# Patient Record
Sex: Female | Born: 1950
Health system: Southern US, Community
[De-identification: ages and names within clinical notes are randomized; demographics above are authoritative.]

## PROBLEM LIST (undated history)

## (undated) ENCOUNTER — Encounter

## (undated) ENCOUNTER — Ambulatory Visit

## (undated) ENCOUNTER — Ambulatory Visit: Attending: Critical Care Medicine | Primary: Critical Care Medicine

## (undated) ENCOUNTER — Telehealth

## (undated) ENCOUNTER — Encounter: Attending: Registered" | Primary: Registered"

## (undated) ENCOUNTER — Encounter
Attending: Student in an Organized Health Care Education/Training Program | Primary: Student in an Organized Health Care Education/Training Program

## (undated) ENCOUNTER — Encounter: Attending: Internal Medicine | Primary: Internal Medicine

## (undated) DIAGNOSIS — I10 Essential (primary) hypertension: Secondary | ICD-10-CM

## (undated) HISTORY — PX: EYE SURGERY: SHX253

## (undated) HISTORY — DX: Essential (primary) hypertension: I10

---

## 2008-02-03 DIAGNOSIS — J449 Chronic obstructive pulmonary disease, unspecified: Secondary | ICD-10-CM | POA: Insufficient documentation

## 2012-12-14 ENCOUNTER — Other Ambulatory Visit: Payer: Self-pay | Admitting: *Deleted

## 2012-12-14 DIAGNOSIS — I714 Abdominal aortic aneurysm, without rupture, unspecified: Secondary | ICD-10-CM

## 2012-12-16 ENCOUNTER — Other Ambulatory Visit: Payer: Self-pay

## 2012-12-19 ENCOUNTER — Ambulatory Visit
Admission: RE | Admit: 2012-12-19 | Discharge: 2012-12-19 | Disposition: A | Discharge status: Home / Self Care | Payer: No Typology Code available for payment source | Source: Ambulatory Visit | Attending: *Deleted | Admitting: *Deleted

## 2012-12-19 DIAGNOSIS — I714 Abdominal aortic aneurysm, without rupture, unspecified: Secondary | ICD-10-CM

## 2013-04-26 DIAGNOSIS — E041 Nontoxic single thyroid nodule: Secondary | ICD-10-CM | POA: Diagnosis not present

## 2013-04-26 DIAGNOSIS — E079 Disorder of thyroid, unspecified: Secondary | ICD-10-CM | POA: Diagnosis not present

## 2013-05-09 DIAGNOSIS — I781 Nevus, non-neoplastic: Secondary | ICD-10-CM | POA: Diagnosis not present

## 2013-05-09 DIAGNOSIS — L819 Disorder of pigmentation, unspecified: Secondary | ICD-10-CM | POA: Diagnosis not present

## 2013-05-09 DIAGNOSIS — D485 Neoplasm of uncertain behavior of skin: Secondary | ICD-10-CM | POA: Diagnosis not present

## 2013-05-09 DIAGNOSIS — D045 Carcinoma in situ of skin of trunk: Secondary | ICD-10-CM | POA: Diagnosis not present

## 2013-05-09 DIAGNOSIS — L570 Actinic keratosis: Secondary | ICD-10-CM | POA: Diagnosis not present

## 2013-05-17 DIAGNOSIS — E063 Autoimmune thyroiditis: Secondary | ICD-10-CM | POA: Diagnosis not present

## 2013-06-07 DIAGNOSIS — D045 Carcinoma in situ of skin of trunk: Secondary | ICD-10-CM | POA: Diagnosis not present

## 2013-06-07 DIAGNOSIS — L578 Other skin changes due to chronic exposure to nonionizing radiation: Secondary | ICD-10-CM | POA: Diagnosis not present

## 2013-07-10 DIAGNOSIS — E069 Thyroiditis, unspecified: Secondary | ICD-10-CM | POA: Diagnosis not present

## 2013-09-19 DIAGNOSIS — H43819 Vitreous degeneration, unspecified eye: Secondary | ICD-10-CM | POA: Diagnosis not present

## 2013-10-12 DIAGNOSIS — L570 Actinic keratosis: Secondary | ICD-10-CM | POA: Diagnosis not present

## 2014-03-21 DIAGNOSIS — E040 Nontoxic diffuse goiter: Secondary | ICD-10-CM | POA: Diagnosis not present

## 2014-03-21 DIAGNOSIS — Z0001 Encounter for general adult medical examination with abnormal findings: Secondary | ICD-10-CM | POA: Diagnosis not present

## 2014-04-13 DIAGNOSIS — L718 Other rosacea: Secondary | ICD-10-CM | POA: Diagnosis not present

## 2014-05-04 DIAGNOSIS — H04123 Dry eye syndrome of bilateral lacrimal glands: Secondary | ICD-10-CM | POA: Diagnosis not present

## 2014-05-29 DIAGNOSIS — H04123 Dry eye syndrome of bilateral lacrimal glands: Secondary | ICD-10-CM | POA: Diagnosis not present

## 2014-07-12 DIAGNOSIS — H01001 Unspecified blepharitis right upper eyelid: Secondary | ICD-10-CM | POA: Diagnosis not present

## 2014-07-12 DIAGNOSIS — H04123 Dry eye syndrome of bilateral lacrimal glands: Secondary | ICD-10-CM | POA: Insufficient documentation

## 2014-07-12 DIAGNOSIS — H0259 Other disorders affecting eyelid function: Secondary | ICD-10-CM | POA: Diagnosis not present

## 2014-07-12 DIAGNOSIS — H029 Unspecified disorder of eyelid: Secondary | ICD-10-CM | POA: Diagnosis not present

## 2014-07-12 DIAGNOSIS — H04209 Unspecified epiphora, unspecified lacrimal gland: Secondary | ICD-10-CM | POA: Diagnosis not present

## 2014-07-12 DIAGNOSIS — H02139 Senile ectropion of unspecified eye, unspecified eyelid: Secondary | ICD-10-CM | POA: Diagnosis not present

## 2014-08-13 DIAGNOSIS — J209 Acute bronchitis, unspecified: Secondary | ICD-10-CM | POA: Diagnosis not present

## 2014-08-29 DIAGNOSIS — R0602 Shortness of breath: Secondary | ICD-10-CM | POA: Diagnosis not present

## 2014-08-29 DIAGNOSIS — R918 Other nonspecific abnormal finding of lung field: Secondary | ICD-10-CM | POA: Diagnosis not present

## 2014-08-29 DIAGNOSIS — J189 Pneumonia, unspecified organism: Secondary | ICD-10-CM | POA: Diagnosis not present

## 2014-09-12 DIAGNOSIS — J168 Pneumonia due to other specified infectious organisms: Secondary | ICD-10-CM | POA: Diagnosis not present

## 2014-09-12 DIAGNOSIS — J4520 Mild intermittent asthma, uncomplicated: Secondary | ICD-10-CM | POA: Diagnosis not present

## 2014-10-27 DIAGNOSIS — H60391 Other infective otitis externa, right ear: Secondary | ICD-10-CM | POA: Diagnosis not present

## 2014-10-31 DIAGNOSIS — J4520 Mild intermittent asthma, uncomplicated: Secondary | ICD-10-CM | POA: Diagnosis not present

## 2014-10-31 DIAGNOSIS — J159 Unspecified bacterial pneumonia: Secondary | ICD-10-CM | POA: Diagnosis not present

## 2014-11-01 DIAGNOSIS — H6122 Impacted cerumen, left ear: Secondary | ICD-10-CM | POA: Diagnosis not present

## 2014-11-01 DIAGNOSIS — B369 Superficial mycosis, unspecified: Secondary | ICD-10-CM | POA: Diagnosis not present

## 2014-11-07 DIAGNOSIS — B369 Superficial mycosis, unspecified: Secondary | ICD-10-CM | POA: Diagnosis not present

## 2014-11-07 DIAGNOSIS — H6063 Unspecified chronic otitis externa, bilateral: Secondary | ICD-10-CM | POA: Diagnosis not present

## 2014-12-20 DIAGNOSIS — H43813 Vitreous degeneration, bilateral: Secondary | ICD-10-CM | POA: Diagnosis not present

## 2014-12-31 DIAGNOSIS — Z85828 Personal history of other malignant neoplasm of skin: Secondary | ICD-10-CM | POA: Diagnosis not present

## 2014-12-31 DIAGNOSIS — Z08 Encounter for follow-up examination after completed treatment for malignant neoplasm: Secondary | ICD-10-CM | POA: Diagnosis not present

## 2014-12-31 DIAGNOSIS — L814 Other melanin hyperpigmentation: Secondary | ICD-10-CM | POA: Diagnosis not present

## 2014-12-31 DIAGNOSIS — D1801 Hemangioma of skin and subcutaneous tissue: Secondary | ICD-10-CM | POA: Diagnosis not present

## 2014-12-31 DIAGNOSIS — L570 Actinic keratosis: Secondary | ICD-10-CM | POA: Diagnosis not present

## 2014-12-31 DIAGNOSIS — L821 Other seborrheic keratosis: Secondary | ICD-10-CM | POA: Diagnosis not present

## 2014-12-31 DIAGNOSIS — L218 Other seborrheic dermatitis: Secondary | ICD-10-CM | POA: Diagnosis not present

## 2015-01-07 DIAGNOSIS — H16213 Exposure keratoconjunctivitis, bilateral: Secondary | ICD-10-CM | POA: Diagnosis not present

## 2015-01-07 DIAGNOSIS — H02102 Unspecified ectropion of right lower eyelid: Secondary | ICD-10-CM | POA: Diagnosis not present

## 2015-01-07 DIAGNOSIS — H04223 Epiphora due to insufficient drainage, bilateral lacrimal glands: Secondary | ICD-10-CM | POA: Diagnosis not present

## 2015-01-07 DIAGNOSIS — E063 Autoimmune thyroiditis: Secondary | ICD-10-CM | POA: Diagnosis not present

## 2015-01-07 DIAGNOSIS — H02132 Senile ectropion of right lower eyelid: Secondary | ICD-10-CM | POA: Diagnosis not present

## 2015-01-07 DIAGNOSIS — Z880 Allergy status to penicillin: Secondary | ICD-10-CM | POA: Diagnosis not present

## 2015-01-07 DIAGNOSIS — H02135 Senile ectropion of left lower eyelid: Secondary | ICD-10-CM | POA: Diagnosis not present

## 2015-01-07 DIAGNOSIS — Z79899 Other long term (current) drug therapy: Secondary | ICD-10-CM | POA: Diagnosis not present

## 2015-01-07 DIAGNOSIS — H02105 Unspecified ectropion of left lower eyelid: Secondary | ICD-10-CM | POA: Diagnosis not present

## 2015-03-18 DIAGNOSIS — L570 Actinic keratosis: Secondary | ICD-10-CM | POA: Diagnosis not present

## 2015-03-27 ENCOUNTER — Ambulatory Visit: Payer: Self-pay | Admitting: Osteopathic Medicine

## 2015-04-01 ENCOUNTER — Encounter: Payer: Self-pay | Admitting: Osteopathic Medicine

## 2015-04-01 ENCOUNTER — Ambulatory Visit (INDEPENDENT_AMBULATORY_CARE_PROVIDER_SITE_OTHER): Payer: Medicare Other | Admitting: Osteopathic Medicine

## 2015-04-01 VITALS — BP 125/75 | HR 80 | Ht 62.0 in | Wt 123.0 lb

## 2015-04-01 DIAGNOSIS — E785 Hyperlipidemia, unspecified: Secondary | ICD-10-CM

## 2015-04-01 DIAGNOSIS — M79604 Pain in right leg: Secondary | ICD-10-CM | POA: Diagnosis not present

## 2015-04-01 DIAGNOSIS — Z8639 Personal history of other endocrine, nutritional and metabolic disease: Secondary | ICD-10-CM | POA: Diagnosis not present

## 2015-04-01 DIAGNOSIS — Z79899 Other long term (current) drug therapy: Secondary | ICD-10-CM | POA: Diagnosis not present

## 2015-04-01 DIAGNOSIS — M810 Age-related osteoporosis without current pathological fracture: Secondary | ICD-10-CM

## 2015-04-01 DIAGNOSIS — Z780 Asymptomatic menopausal state: Secondary | ICD-10-CM | POA: Diagnosis not present

## 2015-04-01 DIAGNOSIS — Z1159 Encounter for screening for other viral diseases: Secondary | ICD-10-CM

## 2015-04-01 NOTE — Progress Notes (Signed)
HPI: Bailey Evans is a 65 y.o. female who presents to [place] today for chief complaint of:  Chief Complaint  Patient presents with  . Establish Care     HIP PAIN  . Location: R hip/buttock . Quality: soreness, no shooting pain or numbness . Timing: worse after sitting, intermittent   . Context: no injury   . Assoc signs/symptoms: no shooting pains down leg, feels a bit dull and sore, no weakness in leg  HISTORY OF THYROID PROBLEMS - Sent to endocrinologist after abnormal thyroid test, sent for scan which was normal. Records reviewed in Care Everywhere, pt was to follow-up as needed w/ endocrine, [place] report mentions mild abn echogenicity c/w thyroiditis but no nodule was noted    Past medical, social and family history reviewed: History reviewed. No pertinent past medical history. History reviewed. No pertinent past surgical history. Social History  Substance Use Topics  . Smoking status: Never Smoker   . Smokeless tobacco: Not on file  . Alcohol Use: Not on file   No family history on file.  Current Outpatient Prescriptions  Medication Sig Dispense Refill  . Calcium-Vitamin D 600-200 MG-UNIT tablet Take by mouth.    . Flaxseed, Linseed, (FLAXSEED OIL) 1000 MG CAPS Take by mouth.    . Multiple Vitamin (MULTI-VITAMINS) TABS Take by mouth.    . Omega-3 Fatty Acids (FISH OIL PO) Take by mouth.     No current facility-administered medications for this visit.   Allergies  Allergen Reactions  . Penicillin G Rash      Review of Systems: CONSTITUTIONAL:  No  fever, no chills, No  unintentional weight changes HEAD/EYES/EARS/NOSE/THROAT: No  headache, no vision change, no hearing change, No  sore throat, No  sinus pressure CARDIAC: No  chest pain, No  pressure, No palpitations, No  orthopnea RESPIRATORY: No  cough, No  shortness of breath/wheeze GASTROINTESTINAL: No  nausea, No  vomiting, No  abdominal pain, No  blood in stool, No   diarrhea, No  constipation  MUSCULOSKELETAL: (+) myalgia/arthralgia GENITOURINARY: No  incontinence, No  abnormal genital bleeding/discharge, (+) nighttime urination SKIN: No  rash/wounds/concerning lesions HEM/ONC: No  easy bruising/bleeding, No  abnormal lymph node ENDOCRINE: No polyuria/polydipsia/polyphagia, No  heat/cold intolerance  NEUROLOGIC: No  weakness, No  dizziness, No  slurred speech PSYCHIATRIC: No  concerns with depression, No  concerns with anxiety, No sleep problems  Exam:  BP 125/75 mmHg  Pulse 80  Ht 5' 2"  (1.575 m)  Wt 123 lb (55.792 kg)  BMI 22.49 kg/m2 Constitutional: VS see above. General Appearance: alert, well-developed, well-nourished, NAD Eyes: Normal lids and conjunctive, non-icteric sclera, PERRLA Ears, Nose, Mouth, Throat: MMM, Normal external inspection ears/nares/mouth/lips/gums, TM normal bilaterally. Pharynx no erythema, no exudate.  Neck: No masses, trachea midline. No thyroid enlargement/tenderness/mass appreciated. No lymphadenopathy Respiratory: Normal respiratory effort. no wheeze, no rhonchi, no rales Cardiovascular: S1/S2 normal, no murmur, no rub/gallop auscultated. RRR. No lower extremity edema. Gastrointestinal: Nontender, no masses. No hepatomegaly, no splenomegaly. No hernia appreciated. Bowel sounds normal. Rectal exam deferred.  Musculoskeletal: Gait normal. No clubbing/cyanosis of digits. Neg straight leg raise bilaterally, neg Trendelenberg  Neurological: No cranial nerve deficit on limited exam. Motor and sensation intact and symmetric Skin: warm, dry, intact. No rash/ulcer. No concerning nevi or subq nodules on limited exam.   Psychiatric: Normal judgment/insight. Normal mood and affect. Oriented x3.    Results for orders placed or performed in visit on 04/01/15 (from the past 72 hour(s))  CBC with Differential/Platelet     Status: None   Collection Time: 04/01/15  2:12 PM  Result Value Ref Range   WBC 6.4 4.0 - 10.5 K/uL   RBC  4.61 3.87 - 5.11 MIL/uL   Hemoglobin 14.3 12.0 - 15.0 g/dL   HCT 42.5 36.0 - 46.0 %   MCV 92.2 78.0 - 100.0 fL   MCH 31.0 26.0 - 34.0 pg   MCHC 33.6 30.0 - 36.0 g/dL   RDW 13.4 11.5 - 15.5 %   Platelets 381 150 - 400 K/uL   MPV 8.9 8.6 - 12.4 fL   Neutrophils Relative % 59 43 - 77 %   Neutro Abs 3.8 1.7 - 7.7 K/uL   Lymphocytes Relative 28 12 - 46 %   Lymphs Abs 1.8 0.7 - 4.0 K/uL   Monocytes Relative 9 3 - 12 %   Monocytes Absolute 0.6 0.1 - 1.0 K/uL   Eosinophils Relative 3 0 - 5 %   Eosinophils Absolute 0.2 0.0 - 0.7 K/uL   Basophils Relative 1 0 - 1 %   Basophils Absolute 0.1 0.0 - 0.1 K/uL   Smear Review Criteria for review not met   COMPLETE METABOLIC PANEL WITH GFR     Status: None   Collection Time: 04/01/15  2:12 PM  Result Value Ref Range   Sodium 135 135 - 146 mmol/L   Potassium 5.1 3.5 - 5.3 mmol/L   Chloride 99 98 - 110 mmol/L   CO2 28 20 - 31 mmol/L   Glucose, Bld 99 65 - 99 mg/dL   BUN 14 7 - 25 mg/dL   Creat 0.59 0.50 - 0.99 mg/dL   Total Bilirubin 0.3 0.2 - 1.2 mg/dL   Alkaline Phosphatase 71 33 - 130 U/L   AST 20 10 - 35 U/L   ALT 14 6 - 29 U/L   Total Protein 6.7 6.1 - 8.1 g/dL   Albumin 4.2 3.6 - 5.1 g/dL   Calcium 9.3 8.6 - 10.4 mg/dL   GFR, Est African American >89 >=60 mL/min   GFR, Est Non African American >89 >=60 mL/min    Comment:   The estimated GFR is a calculation valid for adults (>=59 years old) that uses the CKD-EPI algorithm to adjust for age and sex. It is   not to be used for children, pregnant women, hospitalized patients,    patients on dialysis, or with rapidly changing kidney function. According to the NKDEP, eGFR >89 is normal, 60-89 shows mild impairment, 30-59 shows moderate impairment, 15-29 shows severe impairment and <15 is ESRD.     TSH     Status: None   Collection Time: 04/01/15  2:12 PM  Result Value Ref Range   TSH 1.16 mIU/L    Comment:   Reference Range   > or = 20 Years  0.40-4.50   Pregnancy Range First  trimester  0.26-2.66 Second trimester 0.55-2.73 Third trimester  0.43-2.91   ** Please note change in unit of measure and reference range(s). **     VITAMIN D 25 Hydroxy (Vit-D Deficiency, Fractures)     Status: None   Collection Time: 04/01/15  2:12 PM  Result Value Ref Range   Vit D, 25-Hydroxy 65 30 - 100 ng/mL    Comment: Vitamin D Status           25-OH Vitamin D        Deficiency                <  20 ng/mL        Insufficiency         20 - 29 ng/mL        Optimal             > or = 30 ng/mL   For 25-OH Vitamin D testing on patients on D2-supplementation and patients for whom quantitation of D2 and D3 fractions is required, the QuestAssureD 25-OH VIT D, (D2,D3), LC/MS/MS is recommended: order code [id number] (patients > 2 yrs).   Hepatitis C antibody     Status: None   Collection Time: 04/01/15  2:12 PM  Result Value Ref Range   HCV Ab NEGATIVE NEGATIVE  Lipid panel     Status: None   Collection Time: 04/01/15  2:12 PM  Result Value Ref Range   Cholesterol 185 125 - 200 mg/dL   Triglycerides 71 <150 mg/dL   HDL 97 >=46 mg/dL   Total CHOL/HDL Ratio 1.9 <=5.0 Ratio   VLDL 14 <30 mg/dL   LDL Cholesterol 74 <130 mg/dL    Comment:   Total Cholesterol/HDL Ratio:CHD Risk                        Coronary Heart Disease Risk Table                                        Men       Women          1/2 Average Risk              3.4        3.3              Average Risk              5.0        4.4           2X Average Risk              9.6        7.1           3X Average Risk             23.4       11.0 Use the calculated Patient Ratio above and the CHD Risk table  to determine the patient's CHD Risk.       ASSESSMENT/PLAN:  Medication management - Plan: CBC with Differential/Platelet, COMPLETE METABOLIC PANEL WITH GFR  History of thyroid disease - Plan: TSH  Postmenopausal - Plan: VITAMIN D 25 Hydroxy (Vit-D Deficiency, Fractures)  Osteoporosis - Plan: VITAMIN D 25 Hydroxy  (Vit-D Deficiency, Fractures), DG Bone Density  Need for hepatitis C screening test - Plan: Hepatitis C antibody  Hyperlipidemia - Plan: Lipid panel    FEMALE PREVENTIVE CARE  ANNUAL SCREENING/COUNSELING Tobacco - Never  Alcohol - social drinker Diet/Exercise - HEALTHY HABITS DISCUSSED TO DECREASE CV RISK Sexual Health - None STI - The patient denies history of sexually transmitted disease. INTERESTED IN STI TESTING - no Depression - PQH2 Negative Domestic violence concerns - no HTN SCREENING - SEE VITALS Vaccination status - SEE BELOW  INFECTIOUS DISEASE SCREENING HIV - all adults 15-65 - does not need GC/CT - sexually active - does not need HepC - born 79-1967 - needs TB - if risk/required by employer - does not need  DISEASE SCREENING Lipid - (  Low risk screen M35/F45; High risk screen M25/F35 if HTN, Tob, FH CHD M<55/F<65) - needs DM2 (45+ or Risk = FH 1st deg DM, Hx GDM, overweight/sedentary, high-risk ethnicity, HTN) - needs Osteoporosis - age 37+ or one sooner if risk - needs - Hx on Fosamax for Osteoporosis and had problems with myalgia   CANCER SCREENING Cervical - Pap q3 yr age 52+, Pap + HPV q5y age 43+ - PAP - does not need Breast - Mammo age 32+ (C) and biennial age 51-75 (A) - 54 - needs, will defer for now Lung - annual low dose CT Chest age 83-75 w/ 30+ PY, current/quit past 15 years - CT - does not need Colon - age 54+ or 67 years of age prior to [place] Dx - GI REFERRAL - does not need screened 7 or 8 years ago  ADULT VACCINATION Influenza - annual - already has Td booster every 10 years - was offered and declined by the patient HPV - age <60yo - was not indicated Zoster - age 68+ - already has Pneumonia - age 73+ sooner if risk (DM, smoker, other) - already has, needs second vax this upcoming fall  OTHER Fall - exercise and Vit D age 67+ - needs Consider ASA - age 89-59 - does not need   Return in about 8 months (around 11/29/2015), or as needed and  for annual wellness exams, may change followup depending on lab results, for FLU VACCINE AND SECOND PNEUMONIA VACCINE.

## 2015-04-02 DIAGNOSIS — M79604 Pain in right leg: Secondary | ICD-10-CM | POA: Insufficient documentation

## 2015-04-02 DIAGNOSIS — Z780 Asymptomatic menopausal state: Secondary | ICD-10-CM | POA: Insufficient documentation

## 2015-04-02 DIAGNOSIS — M810 Age-related osteoporosis without current pathological fracture: Secondary | ICD-10-CM | POA: Insufficient documentation

## 2015-04-02 LAB — CBC WITH DIFFERENTIAL/PLATELET
Basophils Absolute: 0.1 10*3/uL (ref 0.0–0.1)
Basophils Relative: 1 % (ref 0–1)
EOS PCT: 3 % (ref 0–5)
Eosinophils Absolute: 0.2 10*3/uL (ref 0.0–0.7)
HEMATOCRIT: 42.5 % (ref 36.0–46.0)
Hemoglobin: 14.3 g/dL (ref 12.0–15.0)
LYMPHS ABS: 1.8 10*3/uL (ref 0.7–4.0)
LYMPHS PCT: 28 % (ref 12–46)
MCH: 31 pg (ref 26.0–34.0)
MCHC: 33.6 g/dL (ref 30.0–36.0)
MCV: 92.2 fL (ref 78.0–100.0)
MPV: 8.9 fL (ref 8.6–12.4)
Monocytes Absolute: 0.6 10*3/uL (ref 0.1–1.0)
Monocytes Relative: 9 % (ref 3–12)
NEUTROS ABS: 3.8 10*3/uL (ref 1.7–7.7)
Neutrophils Relative %: 59 % (ref 43–77)
Platelets: 381 10*3/uL (ref 150–400)
RBC: 4.61 MIL/uL (ref 3.87–5.11)
RDW: 13.4 % (ref 11.5–15.5)
WBC: 6.4 10*3/uL (ref 4.0–10.5)

## 2015-04-02 LAB — COMPLETE METABOLIC PANEL WITH GFR
ALK PHOS: 71 U/L (ref 33–130)
ALT: 14 U/L (ref 6–29)
AST: 20 U/L (ref 10–35)
Albumin: 4.2 g/dL (ref 3.6–5.1)
BUN: 14 mg/dL (ref 7–25)
CALCIUM: 9.3 mg/dL (ref 8.6–10.4)
CO2: 28 mmol/L (ref 20–31)
CREATININE: 0.59 mg/dL (ref 0.50–0.99)
Chloride: 99 mmol/L (ref 98–110)
GFR, Est Non African American: >89 mL/min (ref >=60)
Glucose, Bld: 99 mg/dL (ref 65–99)
Potassium: 5.1 mmol/L (ref 3.5–5.3)
Sodium: 135 mmol/L (ref 135–146)
TOTAL PROTEIN: 6.7 g/dL (ref 6.1–8.1)
Total Bilirubin: 0.3 mg/dL (ref 0.2–1.2)

## 2015-04-02 LAB — LIPID PANEL
CHOL/HDL RATIO: 1.9 ratio (ref <=5.0)
CHOLESTEROL: 185 mg/dL (ref 125–200)
HDL: 97 mg/dL (ref >=46)
LDL Cholesterol: 74 mg/dL (ref <130)
TRIGLYCERIDES: 71 mg/dL (ref <150)
VLDL: 14 mg/dL (ref <30)

## 2015-04-02 LAB — HEPATITIS C ANTIBODY: HCV AB: NEGATIVE

## 2015-04-02 LAB — VITAMIN D 25 HYDROXY (VIT D DEFICIENCY, FRACTURES): VIT D 25 HYDROXY: 65 ng/mL (ref 30–100)

## 2015-04-02 LAB — TSH: TSH: 1.16 mIU/L

## 2015-04-04 ENCOUNTER — Ambulatory Visit (INDEPENDENT_AMBULATORY_CARE_PROVIDER_SITE_OTHER): Payer: Medicare Other | Admitting: Osteopathic Medicine

## 2015-04-04 ENCOUNTER — Encounter: Payer: Self-pay | Admitting: Osteopathic Medicine

## 2015-04-04 VITALS — BP 146/73 | HR 71 | Ht 62.0 in | Wt 124.0 lb

## 2015-04-04 DIAGNOSIS — M25571 Pain in right ankle and joints of right foot: Secondary | ICD-10-CM | POA: Diagnosis not present

## 2015-04-04 DIAGNOSIS — M25472 Effusion, left ankle: Secondary | ICD-10-CM | POA: Diagnosis not present

## 2015-04-04 DIAGNOSIS — M25572 Pain in left ankle and joints of left foot: Secondary | ICD-10-CM | POA: Diagnosis not present

## 2015-04-04 NOTE — Progress Notes (Signed)
HPI: Bailey Evans is a 65 y.o. female who presents to [place] today for chief complaint of:  Chief Complaint  Patient presents with  . Joint Swelling    LEFT ANKLE   ANKLE PAIN/STIFFNESS  . Location: L ankle a bit worse than R, both ankles are a problem though better today thanwhen she made the appt . Quality: swelling, pain . Severity: mild, better today . Duration: started about a week ago  . Context: recently walking on sand a lot while at the beach and then shortly thereafter started having stiffness, no hx fall or other injury . Modifying factors: no Rx/OTC meds tried . Assoc signs/symptoms: no numbness/tingling, no calf pain/claudicatoin, no CP/SOB   Past medical, social and family history reviewed: No past medical history on file. No past surgical history on file. Social History  Substance Use Topics  . Smoking status: Never Smoker   . Smokeless tobacco: Not on file  . Alcohol Use: Not on file   Family History  Problem Relation Age of Onset  . Cancer Mother   . Heart attack Father     Current Outpatient Prescriptions  Medication Sig Dispense Refill  . Calcium-Vitamin D 600-200 MG-UNIT tablet Take by mouth.    . Flaxseed, Linseed, (FLAXSEED OIL) 1000 MG CAPS Take by mouth.    . Multiple Vitamin (MULTI-VITAMINS) TABS Take by mouth.    . Omega-3 Fatty Acids (FISH OIL PO) Take by mouth.     No current facility-administered medications for this visit.   Allergies  Allergen Reactions  . Penicillin G Rash       Review of Systems: MUSCULOSKELETAL: (+) hip and ankle myalgia/arthralgia SKIN: No  rash/wounds/concerning lesions NEUROLOGIC: No  weakness, No  Dizziness  Exam:  BP 146/73 mmHg  Pulse 71  Ht 5' 2"  (1.575 m)  Wt 124 lb (56.246 kg)  BMI 22.67 kg/m2 Constitutional: VS see above. General Appearance: alert, well-developed, well-nourished, NAD Musculoskeletal: Gait normal. No clubbing/cyanosis of digits. Ankles  non-tender medial and lateral malleoli, 0 edema. Drawer test eng bilaterally, Homans neg bilaterally Neurological: . Motor and sensation intact and symmetric Skin: warm, dry, intact. No rash/ulcer. No concerning nevi or subq nodules on limited exam.   Psychiatric: Normal judgment/insight. Normal mood and affect. Oriented x3.    Results for orders placed or performed in visit on 04/01/15 (from the past 72 hour(s))  CBC with Differential/Platelet     Status: None   Collection Time: 04/01/15  2:12 PM  Result Value Ref Range   WBC 6.4 4.0 - 10.5 K/uL   RBC 4.61 3.87 - 5.11 MIL/uL   Hemoglobin 14.3 12.0 - 15.0 g/dL   HCT 42.5 36.0 - 46.0 %   MCV 92.2 78.0 - 100.0 fL   MCH 31.0 26.0 - 34.0 pg   MCHC 33.6 30.0 - 36.0 g/dL   RDW 13.4 11.5 - 15.5 %   Platelets 381 150 - 400 K/uL   MPV 8.9 8.6 - 12.4 fL   Neutrophils Relative % 59 43 - 77 %   Neutro Abs 3.8 1.7 - 7.7 K/uL   Lymphocytes Relative 28 12 - 46 %   Lymphs Abs 1.8 0.7 - 4.0 K/uL   Monocytes Relative 9 3 - 12 %   Monocytes Absolute 0.6 0.1 - 1.0 K/uL   Eosinophils Relative 3 0 - 5 %   Eosinophils Absolute 0.2 0.0 - 0.7 K/uL   Basophils Relative 1 0 - 1 %   Basophils Absolute  0.1 0.0 - 0.1 K/uL   Smear Review Criteria for review not met   COMPLETE METABOLIC PANEL WITH GFR     Status: None   Collection Time: 04/01/15  2:12 PM  Result Value Ref Range   Sodium 135 135 - 146 mmol/L   Potassium 5.1 3.5 - 5.3 mmol/L   Chloride 99 98 - 110 mmol/L   CO2 28 20 - 31 mmol/L   Glucose, Bld 99 65 - 99 mg/dL   BUN 14 7 - 25 mg/dL   Creat 0.59 0.50 - 0.99 mg/dL   Total Bilirubin 0.3 0.2 - 1.2 mg/dL   Alkaline Phosphatase 71 33 - 130 U/L   AST 20 10 - 35 U/L   ALT 14 6 - 29 U/L   Total Protein 6.7 6.1 - 8.1 g/dL   Albumin 4.2 3.6 - 5.1 g/dL   Calcium 9.3 8.6 - 10.4 mg/dL   GFR, Est African American >89 >=60 mL/min   GFR, Est Non African American >89 >=60 mL/min    Comment:   The estimated GFR is a calculation valid for adults (>=82  years old) that uses the CKD-EPI algorithm to adjust for age and sex. It is   not to be used for children, pregnant women, hospitalized patients,    patients on dialysis, or with rapidly changing kidney function. According to the NKDEP, eGFR >89 is normal, 60-89 shows mild impairment, 30-59 shows moderate impairment, 15-29 shows severe impairment and <15 is ESRD.     TSH     Status: None   Collection Time: 04/01/15  2:12 PM  Result Value Ref Range   TSH 1.16 mIU/L    Comment:   Reference Range   > or = 20 Years  0.40-4.50   Pregnancy Range First trimester  0.26-2.66 Second trimester 0.55-2.73 Third trimester  0.43-2.91   ** Please note change in unit of measure and reference range(s). **     VITAMIN D 25 Hydroxy (Vit-D Deficiency, Fractures)     Status: None   Collection Time: 04/01/15  2:12 PM  Result Value Ref Range   Vit D, 25-Hydroxy 65 30 - 100 ng/mL    Comment: Vitamin D Status           25-OH Vitamin D        Deficiency                <20 ng/mL        Insufficiency         20 - 29 ng/mL        Optimal             > or = 30 ng/mL   For 25-OH Vitamin D testing on patients on D2-supplementation and patients for whom quantitation of D2 and D3 fractions is required, the QuestAssureD 25-OH VIT D, (D2,D3), LC/MS/MS is recommended: order code [id number] (patients > 2 yrs).   Hepatitis C antibody     Status: None   Collection Time: 04/01/15  2:12 PM  Result Value Ref Range   HCV Ab NEGATIVE NEGATIVE  Lipid panel     Status: None   Collection Time: 04/01/15  2:12 PM  Result Value Ref Range   Cholesterol 185 125 - 200 mg/dL   Triglycerides 71 <150 mg/dL   HDL 97 >=46 mg/dL   Total CHOL/HDL Ratio 1.9 <=5.0 Ratio   VLDL 14 <30 mg/dL   LDL Cholesterol 74 <130 mg/dL    Comment:   Total Cholesterol/HDL  Ratio:CHD Risk                        Coronary Heart Disease Risk Table                                        Men       Women          1/2 Average Risk              3.4         3.3              Average Risk              5.0        4.4           2X Average Risk              9.6        7.1           3X Average Risk             23.4       11.0 Use the calculated Patient Ratio above and the CHD Risk table  to determine the patient's CHD Risk.    Labs reviewed with patient: all questions answered   ASSESSMENT/PLAN: Likely strain from walking in sand, mostly resolved already. Education re: edema with other alarm symptoms such as calf pain, CP/SOB. RTC/ER precautions reviewed.   Left ankle swelling  Bilateral ankle pain     Return if symptoms worsen or fail to improve.

## 2015-04-11 DIAGNOSIS — J4520 Mild intermittent asthma, uncomplicated: Secondary | ICD-10-CM | POA: Diagnosis not present

## 2015-04-11 DIAGNOSIS — J159 Unspecified bacterial pneumonia: Secondary | ICD-10-CM | POA: Diagnosis not present

## 2015-04-11 DIAGNOSIS — R918 Other nonspecific abnormal finding of lung field: Secondary | ICD-10-CM | POA: Diagnosis not present

## 2015-04-15 ENCOUNTER — Telehealth: Payer: Self-pay

## 2015-04-15 NOTE — Telephone Encounter (Signed)
Topically discuss any medication changes or labs she might need, would recommend office visit to discuss in detail. Typically, hormone labs are not needed in most patients and if she is seriously concerned about hormone imbalance, would recommend coming in to talk to me about it. DHEA has no proven benefit in menopausal symptoms.

## 2015-04-15 NOTE — Telephone Encounter (Signed)
Patient has been scheduled for 04/16/2015 to discuss. [name],CMA

## 2015-04-15 NOTE — Telephone Encounter (Signed)
Patient called stated that she wants to start taking DHEA she wants to know if it is safe to take and she also want to know if she should have a female panel for hormones  done first before she starts to do. Please advise. Bailey Evans,CMA

## 2015-04-16 ENCOUNTER — Encounter: Payer: Self-pay | Admitting: Osteopathic Medicine

## 2015-04-16 ENCOUNTER — Ambulatory Visit: Payer: Medicare Other | Admitting: Osteopathic Medicine

## 2015-04-16 DIAGNOSIS — Z8701 Personal history of pneumonia (recurrent): Secondary | ICD-10-CM | POA: Insufficient documentation

## 2015-04-29 DIAGNOSIS — L570 Actinic keratosis: Secondary | ICD-10-CM | POA: Diagnosis not present

## 2015-06-26 DIAGNOSIS — Z23 Encounter for immunization: Secondary | ICD-10-CM | POA: Diagnosis not present

## 2015-06-26 DIAGNOSIS — S8991XA Unspecified injury of right lower leg, initial encounter: Secondary | ICD-10-CM | POA: Diagnosis not present

## 2015-06-26 DIAGNOSIS — S81811A Laceration without foreign body, right lower leg, initial encounter: Secondary | ICD-10-CM | POA: Diagnosis not present

## 2015-06-28 DIAGNOSIS — Z4801 Encounter for change or removal of surgical wound dressing: Secondary | ICD-10-CM | POA: Diagnosis not present

## 2015-07-05 DIAGNOSIS — Z4801 Encounter for change or removal of surgical wound dressing: Secondary | ICD-10-CM | POA: Diagnosis not present

## 2015-07-05 DIAGNOSIS — T8131XA Disruption of external operation (surgical) wound, not elsewhere classified, initial encounter: Secondary | ICD-10-CM | POA: Diagnosis not present

## 2015-07-08 DIAGNOSIS — Z4802 Encounter for removal of sutures: Secondary | ICD-10-CM | POA: Diagnosis not present

## 2015-07-08 DIAGNOSIS — Z4801 Encounter for change or removal of surgical wound dressing: Secondary | ICD-10-CM | POA: Diagnosis not present

## 2015-07-11 DIAGNOSIS — R030 Elevated blood-pressure reading, without diagnosis of hypertension: Secondary | ICD-10-CM | POA: Diagnosis not present

## 2015-07-11 DIAGNOSIS — Z4802 Encounter for removal of sutures: Secondary | ICD-10-CM | POA: Diagnosis not present

## 2015-07-16 ENCOUNTER — Ambulatory Visit (INDEPENDENT_AMBULATORY_CARE_PROVIDER_SITE_OTHER): Payer: Medicare Other | Admitting: Osteopathic Medicine

## 2015-07-16 VITALS — BP 153/91 | HR 71 | Wt 124.0 lb

## 2015-07-16 DIAGNOSIS — R030 Elevated blood-pressure reading, without diagnosis of hypertension: Secondary | ICD-10-CM

## 2015-07-16 DIAGNOSIS — IMO0001 Reserved for inherently not codable concepts without codable children: Secondary | ICD-10-CM

## 2015-07-16 DIAGNOSIS — IMO0002 Reserved for concepts with insufficient information to code with codable children: Secondary | ICD-10-CM | POA: Insufficient documentation

## 2015-07-16 DIAGNOSIS — T148 Other injury of unspecified body region: Secondary | ICD-10-CM | POA: Diagnosis not present

## 2015-07-16 NOTE — Progress Notes (Signed)
HPI: Bailey Evans is a 65 y.o. female who presents to [place] today for chief complaint of:  Chief Complaint  Patient presents with  . Hospitalization Follow-up    LACERATION . Context: going up wooden steps and tripped and hit R leg. . Location: R leg  . Timing: 06/29/15 . Modifying factors: no drainage, using hibiclens, no peroxide or other irritants.  . Assoc signs/symptoms: no fever/chills.   BLOOD PRESSURE HIGH   Started noticing high when going back and forth to ER for laceration follow-up, was about in 150's.    Past medical, social and family history reviewed: No past medical history on file. No past surgical history on file. Social History  Substance Use Topics  . Smoking status: Never Smoker   . Smokeless tobacco: Not on file  . Alcohol Use: Not on file   Family History  Problem Relation Age of Onset  . Cancer Mother   . Heart attack Father     Current Outpatient Prescriptions  Medication Sig Dispense Refill  . Calcium-Vitamin D 600-200 MG-UNIT tablet Take by mouth.    . Flaxseed, Linseed, (FLAXSEED OIL) 1000 MG CAPS Take by mouth.    . Multiple Vitamin (MULTI-VITAMINS) TABS Take by mouth.    . Omega-3 Fatty Acids (FISH OIL PO) Take by mouth.     No current facility-administered medications for this visit.   Allergies  Allergen Reactions  . Penicillin G Rash      Review of Systems: CONSTITUTIONAL:  No  fever, no chills, No recent illness, HEAD/EYES/EARS/NOSE/THROAT: No  headache, no vision change CARDIAC: No  chest pain, No  pressure, No palpitations, RESPIRATORY: No  cough, No  shortness of breath GASTROINTESTINAL: No  nausea MUSCULOSKELETAL: No  myalgia/arthralgia SKIN: No  rash/wounds/concerning lesions, (+) laceration as per HPI  Exam:  BP 153/91 mmHg  Pulse 71  Wt 124 lb (56.246 kg) Constitutional: VS see above. General Appearance: alert, well-developed, well-nourished, NAD Eyes: Normal lids and  conjunctive, non-icteric sclera Ears, Nose, Mouth, Throat: MMM, Normal external inspection ears/nares/mouth/lips/gums Neck: No masses, trachea midline.  Respiratory: Normal respiratory effort. no wheeze, no rhonchi, no rales Cardiovascular: S1/S2 normal, no murmur, no rub/gallop auscultated. RRR. No lower extremity edema. Musculoskeletal: Gait normal. No clubbing/cyanosis of digits.  Skin: healing laceration R shin, looking like healing appropriately, skin otherwise warm, dry, intact. No rash/ulcer. No concerning nevi or subq nodules on limited exam.      ASSESSMENT/PLAN:  Elevated blood pressure - manual recheck 145/85 - pt would like to hold off on meds for now, info printed on DASH and Mediterranean diets, RTC 4 - 6 weeks recheck  Laceration - healing well, wound care instructions reviewed   All questions were answered. Visit summary with medication list and pertinent instructions was printed for patient to review. ER/RTC precautions were reviewed with the patient. Return in about 6 weeks (around 08/27/2015) for RECHECK BLOOD PRESSURE .  Total time spent 25 minutes, greater than 50% of the visit was counseling and coordinating care for diagnosis of HTN and laceration.

## 2015-07-16 NOTE — Patient Instructions (Signed)
Mediterrean Diet   Why follow it? Research shows. . Those who follow the Mediterranean diet have a reduced risk of heart disease  . The diet is associated with a reduced incidence of Parkinson's and Alzheimer's diseases . People following the diet may have longer life expectancies and lower rates of chronic diseases  . The Dietary Guidelines for Americans recommends the Mediterranean diet as an eating plan to promote health and prevent disease  What Is the Mediterranean Diet?  . Healthy eating plan based on typical foods and recipes of Mediterranean-style cooking . The diet is primarily a plant based diet; these foods should make up a majority of meals   Starches - Plant based foods should make up a majority of meals - They are an important sources of vitamins, minerals, energy, antioxidants, and fiber - Choose whole grains, foods high in fiber and minimally processed items  - Typical grain sources include wheat, oats, barley, corn, brown rice, bulgar, farro, millet, polenta, couscous  - Various types of beans include chickpeas, lentils, fava beans, black beans, white beans   Fruits  Veggies - Large quantities of antioxidant rich fruits & veggies; 6 or more servings  - Vegetables can be eaten raw or lightly drizzled with oil and cooked  - Vegetables common to the traditional Mediterranean Diet include: artichokes, arugula, beets, broccoli, brussel sprouts, cabbage, carrots, celery, collard greens, cucumbers, eggplant, kale, leeks, lemons, lettuce, mushrooms, okra, onions, peas, peppers, potatoes, pumpkin, radishes, rutabaga, shallots, spinach, sweet potatoes, turnips, zucchini - Fruits common to the Mediterranean Diet include: apples, apricots, avocados, cherries, clementines, dates, figs, grapefruits, grapes, melons, nectarines, oranges, peaches, pears, pomegranates, strawberries, tangerines  Fats - Replace butter and margarine with healthy oils, such as olive oil, canola oil, and tahini  -  Limit nuts to no more than a handful a day  - Nuts include walnuts, almonds, pecans, pistachios, pine nuts  - Limit or avoid candied, honey roasted or heavily salted nuts - Olives are central to the [employer] - can be eaten whole or used in a variety of dishes   Meats Protein - Limiting red meat: no more than a few times a month - When eating red meat: choose lean cuts and keep the portion to the size of deck of cards - Eggs: approx. 0 to 4 times a week  - Fish and lean poultry: at least 2 a week  - Healthy protein sources include, chicken, [place], lean beef, lamb - Increase intake of seafood such as tuna, salmon, trout, mackerel, shrimp, scallops - Avoid or limit high fat processed meats such as sausage and bacon  Dairy - Include moderate amounts of low fat dairy products  - Focus on healthy dairy such as fat free yogurt, skim milk, low or reduced fat cheese - Limit dairy products higher in fat such as whole or 2% milk, cheese, ice cream  Alcohol - Moderate amounts of red wine is ok  - No more than 5 oz daily for women (all ages) and men older than age 67  - No more than 10 oz of wine daily for men younger than 87  Other - Limit sweets and other desserts  - Use herbs and spices instead of salt to flavor foods  - Herbs and spices common to the traditional Mediterranean Diet include: basil, bay leaves, chives, cloves, cumin, fennel, garlic, lavender, marjoram, mint, oregano, parsley, pepper, rosemary, sage, savory, sumac, tarragon, thyme   It's not just a diet, it's a lifestyle:  .  The Mediterranean diet includes lifestyle factors typical of those in the region  . Foods, drinks and meals are best eaten with others and savored . Daily physical activity is important for overall good health . This could be strenuous exercise like running and aerobics . This could also be more leisurely activities such as walking, housework, yard-work, or taking the stairs . Moderation is the key; a  balanced and healthy diet accommodates most foods and drinks . Consider portion sizes and frequency of consumption of certain foods   Meal Ideas & Options:  . Breakfast:  o Whole wheat toast or whole wheat English muffins with peanut butter & hard boiled egg o Steel cut oats topped with apples & cinnamon and skim milk  o Fresh fruit: banana, strawberries, melon, berries, peaches  o Smoothies: strawberries, bananas, greek yogurt, peanut butter o Low fat greek yogurt with blueberries and granola  o Egg white omelet with spinach and mushrooms o Breakfast couscous: whole wheat couscous, apricots, skim milk, cranberries  . Sandwiches:  o Hummus and grilled vegetables (peppers, zucchini, squash) on whole wheat bread   o Grilled chicken on whole wheat pita with lettuce, tomatoes, cucumbers or tzatziki  o Tuna salad on whole wheat bread: tuna salad made with greek yogurt, olives, red peppers, capers, green onions o Garlic rosemary lamb pita: lamb sauted with garlic, rosemary, salt & pepper; add lettuce, cucumber, greek yogurt to pita - flavor with lemon juice and black pepper  . Seafood:  o Mediterranean grilled salmon, seasoned with garlic, basil, parsley, lemon juice and black pepper o Shrimp, lemon, and spinach whole-grain pasta salad made with low fat greek yogurt  o Seared scallops with lemon orzo  o Seared tuna steaks seasoned salt, pepper, coriander topped with tomato mixture of olives, tomatoes, olive oil, minced garlic, parsley, green onions and cappers  . Meats:  o Herbed greek chicken salad with kalamata olives, cucumber, feta  o Red bell peppers stuffed with spinach, bulgur, lean ground beef (or lentils) & topped with feta   o Kebabs: skewers of chicken, tomatoes, onions, zucchini, squash  o [place] burgers: made with red onions, mint, dill, lemon juice, feta cheese topped with roasted red peppers . Vegetarian o Cucumber salad: cucumbers, artichoke hearts, celery, red onion, feta  cheese, tossed in olive oil & lemon juice  o Hummus and whole grain pita points with a greek salad (lettuce, tomato, feta, olives, cucumbers, red onion) o Lentil soup with celery, carrots made with vegetable broth, garlic, salt and pepper  o Tabouli salad: parsley, bulgur, mint, scallions, cucumbers, tomato, radishes, lemon juice, olive oil, salt and pepper.    DASH Eating Plan DASH stands for "Dietary Approaches to Stop Hypertension." The DASH eating plan is a healthy eating plan that has been shown to reduce high blood pressure (hypertension). Additional health benefits may include reducing the risk of type 2 diabetes mellitus, heart disease, and stroke. The DASH eating plan may also help with weight loss. WHAT DO I NEED TO KNOW ABOUT THE DASH EATING PLAN? For the DASH eating plan, you will follow these general guidelines:  Choose foods with a percent daily value for sodium of less than 5% (as listed on the food label).  Use salt-free seasonings or herbs instead of table salt or sea salt.  Check with your health care provider or pharmacist before using salt substitutes.  Eat lower-sodium products, often labeled as "lower sodium" or "no salt added."  Eat fresh foods.  Eat more vegetables, fruits,  and low-fat dairy products.  Choose whole grains. Look for the word "whole" as the first word in the ingredient list.  Choose fish and skinless chicken or [place] more often than red meat. Limit fish, poultry, and meat to 6 oz (170 g) each day.  Limit sweets, desserts, sugars, and sugary drinks.  Choose heart-healthy fats.  Limit cheese to 1 oz (28 g) per day.  Eat more home-cooked food and less restaurant, buffet, and fast food.  Limit fried foods.  Cook foods using methods other than frying.  Limit canned vegetables. If you do use them, rinse them well to decrease the sodium.  When eating at a restaurant, ask that your food be prepared with less salt, or no salt if possible. WHAT  FOODS CAN I EAT? Seek help from a dietitian for individual calorie needs. Grains Whole grain or whole wheat bread. Brown rice. Whole grain or whole wheat pasta. Quinoa, bulgur, and whole grain cereals. Low-sodium cereals. Corn or whole wheat flour tortillas. Whole grain cornbread. Whole grain crackers. Low-sodium crackers. Vegetables Fresh or frozen vegetables (raw, steamed, roasted, or grilled). Low-sodium or reduced-sodium tomato and vegetable juices. Low-sodium or reduced-sodium tomato sauce and paste. Low-sodium or reduced-sodium canned vegetables.  Fruits All fresh, canned (in natural juice), or frozen fruits. Meat and Other Protein Products Ground beef (85% or leaner), grass-fed beef, or beef trimmed of fat. Skinless chicken or [place]. Ground chicken or [place]. Pork trimmed of fat. All fish and seafood. Eggs. Dried beans, peas, or lentils. Unsalted nuts and seeds. Unsalted canned beans. Dairy Low-fat dairy products, such as skim or 1% milk, 2% or reduced-fat cheeses, low-fat ricotta or cottage cheese, or plain low-fat yogurt. Low-sodium or reduced-sodium cheeses. Fats and Oils Tub margarines without trans fats. Light or reduced-fat mayonnaise and salad dressings (reduced sodium). Avocado. Safflower, olive, or canola oils. Natural peanut or almond butter. Other Unsalted popcorn and pretzels. The items listed above may not be a complete list of recommended foods or beverages. Contact your dietitian for more options. WHAT FOODS ARE NOT RECOMMENDED? Grains White bread. White pasta. White rice. Refined cornbread. Bagels and croissants. Crackers that contain trans fat. Vegetables Creamed or fried vegetables. Vegetables in a cheese sauce. Regular canned vegetables. Regular canned tomato sauce and paste. Regular tomato and vegetable juices. Fruits Dried fruits. Canned fruit in light or heavy syrup. Fruit juice. Meat and Other Protein Products Fatty cuts of meat. Ribs, chicken wings, bacon,  sausage, bologna, salami, chitterlings, fatback, hot dogs, bratwurst, and packaged luncheon meats. Salted nuts and seeds. Canned beans with salt. Dairy Whole or 2% milk, cream, half-and-half, and cream cheese. Whole-fat or sweetened yogurt. Full-fat cheeses or blue cheese. Nondairy creamers and whipped toppings. Processed cheese, cheese spreads, or cheese curds. Condiments Onion and garlic salt, seasoned salt, table salt, and sea salt. Canned and packaged gravies. Worcestershire sauce. Tartar sauce. Barbecue sauce. Teriyaki sauce. Soy sauce, including reduced sodium. Steak sauce. Fish sauce. Oyster sauce. Cocktail sauce. Horseradish. Ketchup and mustard. Meat flavorings and tenderizers. Bouillon cubes. Hot sauce. Tabasco sauce. Marinades. Taco seasonings. Relishes. Fats and Oils Butter, stick margarine, lard, shortening, ghee, and bacon fat. Coconut, palm kernel, or palm oils. Regular salad dressings. Other Pickles and olives. Salted popcorn and pretzels. The items listed above may not be a complete list of foods and beverages to avoid. Contact your dietitian for more information. WHERE CAN I FIND MORE INFORMATION? National Heart, Lung, and Blood Institute: travelstabloid.com   This information is not intended to replace advice given  to you by your health care provider. Make sure you discuss any questions you have with your health care provider.   Document Released: 01/08/2011 Document Revised: 02/09/2014 Document Reviewed: 11/23/2012 Elsevier Interactive Patient Education [employer].

## 2015-08-27 ENCOUNTER — Ambulatory Visit (INDEPENDENT_AMBULATORY_CARE_PROVIDER_SITE_OTHER): Payer: Medicare Other | Admitting: Osteopathic Medicine

## 2015-08-27 ENCOUNTER — Encounter: Payer: Self-pay | Admitting: Osteopathic Medicine

## 2015-08-27 VITALS — BP 150/80 | HR 84 | Ht 62.0 in | Wt 125.0 lb

## 2015-08-27 DIAGNOSIS — I10 Essential (primary) hypertension: Secondary | ICD-10-CM

## 2015-08-27 MED ORDER — LOSARTAN POTASSIUM 25 MG PO TABS: 25.0000 mg | ORAL_TABLET | Freq: Every day | ORAL | 1 refills | Status: DC

## 2015-08-27 NOTE — Progress Notes (Signed)
HPI: Bailey Evans is a 64 y.o. female who presents to [facility] today for chief complaint of:  Chief Complaint  Patient presents with  . Follow-up    BLOOD PRESSURE     BLOOD PRESSURE HIGH   Started noticing high when going back and forth to ER for laceration follow-up, was about in 150's. We had opted to just continue monitoring the blood pressure, persistently elevated today.   Past medical, social and family history reviewed: No past medical history on file. No past surgical history on file. Social History  Substance Use Topics  . Smoking status: Never Smoker  . Smokeless tobacco: Not on file  . Alcohol use Not on file   Family History  Problem Relation Age of Onset  . Cancer Mother   . Heart attack Father     Current Outpatient Prescriptions  Medication Sig Dispense Refill  . Calcium-Vitamin D 600-200 MG-UNIT tablet Take by mouth.    . Flaxseed, Linseed, (FLAXSEED OIL) 1000 MG CAPS Take by mouth.    . Multiple Vitamin (MULTI-VITAMINS) TABS Take by mouth.    . Omega-3 Fatty Acids (FISH OIL PO) Take by mouth.     No current facility-administered medications for this visit.    Allergies  Allergen Reactions  . Penicillin G Rash      Review of Systems: CONSTITUTIONAL:  No  fever, no chills, No recent illness, HEAD/EYES/EARS/NOSE/THROAT: No  headache, no vision change CARDIAC: No  chest pain, No  pressure, No palpitations, RESPIRATORY: No  cough, No  shortness of breath   Exam:  BP (!) 150/80   Pulse 84   Ht 5\' 2"  (1.575 m)   Wt 125 lb (56.7 kg)   BMI 22.86 kg/m  manual blood pressure about 155/80. Constitutional: VS see above. General Appearance: alert, well-developed, well-nourished, NAD Ears, Nose, Mouth, Throat: MMM, Neck: No masses, trachea midline.  Respiratory: Normal respiratory effort. no wheeze, no rhonchi, no rales Cardiovascular: S1/S2 normal, no murmur, no rub/gallop auscultated. RRR. No lower extremity  edema. Musculoskeletal: Gait normal. No clubbing/cyanosis of digits.    ASSESSMENT/PLAN:   Essential hypertension - Plan: losartan (COZAAR) 25 MG tablet, Basic metabolic panel   All questions were answered. Visit summary with medication list and pertinent instructions was printed for patient to review. ER/RTC precautions were reviewed with the patient. Return in about 2 weeks (around 09/10/2015) for [place], LABS.

## 2015-09-09 ENCOUNTER — Ambulatory Visit (INDEPENDENT_AMBULATORY_CARE_PROVIDER_SITE_OTHER): Payer: Medicare Other | Admitting: Osteopathic Medicine

## 2015-09-09 ENCOUNTER — Other Ambulatory Visit: Payer: Self-pay | Admitting: Osteopathic Medicine

## 2015-09-09 VITALS — BP 143/85 | HR 81

## 2015-09-09 DIAGNOSIS — I10 Essential (primary) hypertension: Secondary | ICD-10-CM

## 2015-09-09 NOTE — Progress Notes (Signed)
Nurse notes reviewed, nothing to add. Blood pressure bit better, plan to follow-up in 3 months with visit with Dr. Sheppard Coil  Current Outpatient Prescriptions on File Prior to Visit  Medication Sig Dispense Refill  . Calcium-Vitamin D 600-200 MG-UNIT tablet Take by mouth.    . Flaxseed, Linseed, (FLAXSEED OIL) 1000 MG CAPS Take by mouth.    . losartan (COZAAR) 25 MG tablet Take 1 tablet (25 mg total) by mouth daily. 30 tablet 1  . Multiple Vitamin (MULTI-VITAMINS) TABS Take by mouth.    . Omega-3 Fatty Acids (FISH OIL PO) Take by mouth.     No current facility-administered medications on file prior to visit.

## 2015-09-09 NOTE — Progress Notes (Signed)
   Subjective:    Patient ID: Bailey Evans, female    DOB: 1950-12-25, 65 y.o.   MRN: [id number]  HPI  Pt here for BP check.  143/75    Review of Systems     Objective:   Physical Exam        Assessment & Plan:  Will follow up as requested by MD.  Pt given lab slips for bloodwork.

## 2015-09-10 ENCOUNTER — Telehealth: Payer: Self-pay

## 2015-09-10 ENCOUNTER — Ambulatory Visit: Payer: Medicare Other

## 2015-09-10 LAB — BASIC METABOLIC PANEL
BUN: 12 mg/dL (ref 7–25)
CALCIUM: 9.2 mg/dL (ref 8.6–10.4)
CO2: 28 mmol/L (ref 20–31)
Chloride: 97 mmol/L — ABNORMAL LOW (ref 98–110)
Creat: 0.62 mg/dL (ref 0.50–0.99)
GLUCOSE: 97 mg/dL (ref 65–99)
POTASSIUM: 4 mmol/L (ref 3.5–5.3)
Sodium: 134 mmol/L — ABNORMAL LOW (ref 135–146)

## 2015-09-10 NOTE — Telephone Encounter (Signed)
Patient called stated that she is having Insomnia, she wants to know if it could be coming from the Losartan? Please advise. [name],CMA

## 2015-09-11 NOTE — Telephone Encounter (Signed)
Insomnia is not listed as a side effect of that medication

## 2015-09-11 NOTE — Telephone Encounter (Signed)
Left detailed message on patient vm with information as noted below. Advised patient to call the office and schedule an appointment to see PCP about sleep problems. [name],CMA

## 2015-09-18 ENCOUNTER — Other Ambulatory Visit: Payer: Self-pay

## 2015-09-18 DIAGNOSIS — I10 Essential (primary) hypertension: Secondary | ICD-10-CM

## 2015-09-18 MED ORDER — LOSARTAN POTASSIUM 25 MG PO TABS: 25.0000 mg | ORAL_TABLET | Freq: Every day | ORAL | 0 refills | Status: DC

## 2015-09-18 NOTE — Telephone Encounter (Signed)
Patient requested Losartan 25 mg be sent to [facility]. A 90 day supply was sent. Bailey Evans,CMA

## 2015-10-09 DIAGNOSIS — D485 Neoplasm of uncertain behavior of skin: Secondary | ICD-10-CM | POA: Diagnosis not present

## 2015-10-09 DIAGNOSIS — D225 Melanocytic nevi of trunk: Secondary | ICD-10-CM | POA: Diagnosis not present

## 2015-10-09 DIAGNOSIS — L578 Other skin changes due to chronic exposure to nonionizing radiation: Secondary | ICD-10-CM | POA: Diagnosis not present

## 2015-10-09 DIAGNOSIS — L821 Other seborrheic keratosis: Secondary | ICD-10-CM | POA: Diagnosis not present

## 2015-10-09 DIAGNOSIS — Z85828 Personal history of other malignant neoplasm of skin: Secondary | ICD-10-CM | POA: Diagnosis not present

## 2015-10-09 DIAGNOSIS — Z08 Encounter for follow-up examination after completed treatment for malignant neoplasm: Secondary | ICD-10-CM | POA: Diagnosis not present

## 2015-10-09 DIAGNOSIS — C44722 Squamous cell carcinoma of skin of right lower limb, including hip: Secondary | ICD-10-CM | POA: Diagnosis not present

## 2015-10-09 DIAGNOSIS — L905 Scar conditions and fibrosis of skin: Secondary | ICD-10-CM | POA: Diagnosis not present

## 2015-10-09 DIAGNOSIS — D2272 Melanocytic nevi of left lower limb, including hip: Secondary | ICD-10-CM | POA: Diagnosis not present

## 2015-10-09 DIAGNOSIS — D2271 Melanocytic nevi of right lower limb, including hip: Secondary | ICD-10-CM | POA: Diagnosis not present

## 2015-10-09 DIAGNOSIS — L814 Other melanin hyperpigmentation: Secondary | ICD-10-CM | POA: Diagnosis not present

## 2015-10-09 DIAGNOSIS — L218 Other seborrheic dermatitis: Secondary | ICD-10-CM | POA: Diagnosis not present

## 2015-10-09 DIAGNOSIS — D2261 Melanocytic nevi of right upper limb, including shoulder: Secondary | ICD-10-CM | POA: Diagnosis not present

## 2015-10-09 DIAGNOSIS — D2262 Melanocytic nevi of left upper limb, including shoulder: Secondary | ICD-10-CM | POA: Diagnosis not present

## 2015-10-22 ENCOUNTER — Other Ambulatory Visit: Payer: Self-pay | Admitting: Osteopathic Medicine

## 2015-10-22 DIAGNOSIS — I10 Essential (primary) hypertension: Secondary | ICD-10-CM

## 2015-10-28 DIAGNOSIS — C44722 Squamous cell carcinoma of skin of right lower limb, including hip: Secondary | ICD-10-CM | POA: Diagnosis not present

## 2015-10-28 DIAGNOSIS — L570 Actinic keratosis: Secondary | ICD-10-CM | POA: Diagnosis not present

## 2015-11-06 ENCOUNTER — Ambulatory Visit: Payer: Medicare Other

## 2015-11-07 ENCOUNTER — Ambulatory Visit (INDEPENDENT_AMBULATORY_CARE_PROVIDER_SITE_OTHER): Payer: Medicare Other | Admitting: Osteopathic Medicine

## 2015-11-07 DIAGNOSIS — Z23 Encounter for immunization: Secondary | ICD-10-CM | POA: Diagnosis not present

## 2015-11-07 NOTE — Progress Notes (Signed)
Flu shot given LD without complication.

## 2015-11-18 DIAGNOSIS — Z48817 Encounter for surgical aftercare following surgery on the skin and subcutaneous tissue: Secondary | ICD-10-CM | POA: Diagnosis not present

## 2015-12-16 ENCOUNTER — Other Ambulatory Visit: Payer: Self-pay | Admitting: Osteopathic Medicine

## 2015-12-16 DIAGNOSIS — I10 Essential (primary) hypertension: Secondary | ICD-10-CM

## 2016-01-02 ENCOUNTER — Encounter: Payer: Self-pay | Admitting: Osteopathic Medicine

## 2016-01-02 ENCOUNTER — Other Ambulatory Visit: Payer: Self-pay

## 2016-01-02 ENCOUNTER — Ambulatory Visit (INDEPENDENT_AMBULATORY_CARE_PROVIDER_SITE_OTHER): Payer: Medicare Other | Admitting: Osteopathic Medicine

## 2016-01-02 VITALS — BP 125/75 | HR 68 | Ht 62.0 in | Wt 126.0 lb

## 2016-01-02 DIAGNOSIS — M810 Age-related osteoporosis without current pathological fracture: Secondary | ICD-10-CM

## 2016-01-02 DIAGNOSIS — I10 Essential (primary) hypertension: Secondary | ICD-10-CM | POA: Diagnosis not present

## 2016-01-02 DIAGNOSIS — Z780 Asymptomatic menopausal state: Secondary | ICD-10-CM

## 2016-01-02 MED ORDER — LOSARTAN POTASSIUM 25 MG PO TABS: 25.0000 mg | ORAL_TABLET | Freq: Every day | ORAL | 3 refills | Status: DC

## 2016-01-02 NOTE — Progress Notes (Signed)
HPI: Bailey Evans is a 65 y.o. female  who presents to [facility] today, 01/02/16,  for chief complaint of:  Chief Complaint  Patient presents with  . Follow-up    BLOOD PRESSURE     HTN: Patient reports she is compliant with her Cozaar. Denies headache, chest pain, or swelling in lower extremities.     Past medical, surgical, social and family history reviewed: Patient Active Problem List   Diagnosis Date Noted  . Elevated blood pressure 07/16/2015  . Laceration 07/16/2015  . Pneumonia 04/16/2015  . Right leg pain 04/02/2015  . Osteoporosis 04/02/2015  . Postmenopausal 04/02/2015   No past surgical history on file. Social History  Substance Use Topics  . Smoking status: Never Smoker  . Smokeless tobacco: Not on file  . Alcohol use Not on file   Family History  Problem Relation Age of Onset  . Cancer Mother   . Heart attack Father      Current medication list and allergy/intolerance information reviewed:   Current Outpatient Prescriptions on File Prior to Visit  Medication Sig Dispense Refill  . Calcium-Vitamin D 600-200 MG-UNIT tablet Take by mouth.    . Flaxseed, Linseed, (FLAXSEED OIL) 1000 MG CAPS Take by mouth.    . losartan (COZAAR) 25 MG tablet Take 1 tablet (25 mg total) by mouth daily. 90 tablet 0  . losartan (COZAAR) 25 MG tablet Take 1 tablet (25 mg total) by mouth daily. APPOINTMENT NEEDED FOR FURTHER REFILLS 30 tablet 0  . Multiple Vitamin (MULTI-VITAMINS) TABS Take by mouth.    . Omega-3 Fatty Acids (FISH OIL PO) Take by mouth.     No current facility-administered medications on file prior to visit.    Allergies  Allergen Reactions  . Penicillin G Rash      Review of Systems:  Constitutional: No recent illness  HEENT: No  headache, no vision change  Cardiac: No  chest pain, No  pressure, No palpitations  Respiratory:  No  shortness of breath. No  Cough  Exam:  BP (!) 145/85   Pulse 68   Ht 5\' 2"   (1.575 m)   Wt 126 lb (57.2 kg)   BMI 23.05 kg/m   Constitutional: VS see above. General Appearance: alert, well-developed, well-nourished, NAD  Eyes: Normal lids and conjunctive, non-icteric sclera  Ears, Nose, Mouth, Throat: MMM, Normal external inspection ears/nares/mouth/lips/gums.  Neck: No masses, trachea midline.   Respiratory: Normal respiratory effort. no wheeze, no rhonchi, no rales  Cardiovascular: S1/S2 normal, no murmur, no rub/gallop auscultated. RRR.   Musculoskeletal: Gait normal. Symmetric and independent movement of all extremities  Neurological: Normal balance/coordination. No tremor.  Skin: warm, dry, intact.   Psychiatric: Normal judgment/insight. Normal mood and affect. Oriented x3.     ASSESSMENT/PLAN: Labs prior to annual at next visit. OK to fill Rx for 6 months if needed - pt to call pharmacy to have them send request   Essential hypertension - Plan: Lipid panel, CMP and Liver, TSH  Postmenopausal - Plan: VITAMIN D 25 Hydroxy (Vit-D Deficiency, Fractures)  Osteoporosis without current pathological fracture, unspecified osteoporosis type - Plan: VITAMIN D 25 Hydroxy (Vit-D Deficiency, Fractures)     Visit summary with medication list and pertinent instructions was printed for patient to review. All questions at time of visit were answered - patient instructed to contact office with any additional concerns. ER/RTC precautions were reviewed with the patient. Follow-up plan: Return in about 2 months (around 03/05/2016) for annual physical  or earlier as needed.  Labs ordered for annual physical, did not bill for postmenopausal/osteoporosis today

## 2016-01-02 NOTE — Telephone Encounter (Signed)
Patient request refill for Losartan 25 mg #90 3 refills sent to pharmacy. [name],CMA

## 2016-02-17 ENCOUNTER — Telehealth: Payer: Medicare Other | Admitting: Family

## 2016-02-17 ENCOUNTER — Telehealth: Payer: Self-pay

## 2016-02-17 DIAGNOSIS — R030 Elevated blood-pressure reading, without diagnosis of hypertension: Secondary | ICD-10-CM

## 2016-02-17 DIAGNOSIS — B002 Herpesviral gingivostomatitis and pharyngotonsillitis: Secondary | ICD-10-CM

## 2016-02-17 DIAGNOSIS — Z0189 Encounter for other specified special examinations: Secondary | ICD-10-CM

## 2016-02-17 DIAGNOSIS — Z13220 Encounter for screening for lipoid disorders: Secondary | ICD-10-CM

## 2016-02-17 DIAGNOSIS — Z0000 Encounter for general adult medical examination without abnormal findings: Secondary | ICD-10-CM

## 2016-02-17 DIAGNOSIS — M810 Age-related osteoporosis without current pathological fracture: Secondary | ICD-10-CM

## 2016-02-17 MED ORDER — VALACYCLOVIR HCL 1 G PO TABS: 2000.0000 mg | ORAL_TABLET | Freq: Two times a day (BID) | ORAL | 0 refills | Status: DC

## 2016-02-17 NOTE — Telephone Encounter (Signed)
Patient notified

## 2016-02-17 NOTE — Telephone Encounter (Signed)
Patient called stated that she has an appt on the 25th for annual exam. She is requesting an or for labs so that you will have her results when she comes in for her appointment. She also is requesting to get her B12 and B6 or both checked, her reasing is that she doesn't eat a lot of meats and she she just wants them checked. Please advise. Suanne Marker Cunningham,CMA

## 2016-02-17 NOTE — Telephone Encounter (Signed)
Orders are in  Medicare probably will not cover the B6 and B12 testing, just FYI for the patient

## 2016-02-17 NOTE — Progress Notes (Signed)
We are sorry that you are not feeling well.  Here is how we plan to help!  Based on what you have shared with me it looks like you have a Cold Sore. A cold sore (fever blister) is a skin infection caused by the herpes simplex virus (HSV-1). HSV-1 is closely related to the virus that causes genital herpes (HSV-2), but they are not the same even though both viruses can cause oral and genital infections. Cold sores are small, fluid-filled sores inside of the mouth or on the lips, gums, nose, chin, cheeks, or fingers.   The herpes simplex virus can be easily passed (contagious) to other people through close personal contact, such as kissing or sharing personal items. The virus can also spread to other parts of the body, such as the eyes or genitals. Cold sores are contagious until the sores crust over completely. They often heal within 2 weeks. Once a person is infected, the herpes simplex virus remains permanently in the body. Therefore, there is no cure for cold sores, and they often recur when a person is tired, stressed, sick, or gets too much sun. Additional factors that can cause a recurrence include hormone changes in menstruation or pregnancy, certain drugs, and cold weather.  CAUSES  Cold sores are caused by the herpes simplex virus. The virus is spread from person to person through close contact, such as through kissing, touching the affected area, or sharing personal items such as lip balm, razors, or eating utensils.   I have sent in a prescription to your pharmacy of Valacyclovir, or Valtrex, 2000 mg twice a day for 1 day.   HOME CARE INSTRUCTIONS  Only take over-the-counter or prescription medicines for pain, discomfort, or fever as directed by your caregiver. Do not use aspirin.   Use a cotton-tip swab to apply creams or gels to your sores.   Do not touch the sores or pick the scabs. Wash your hands often. Do not touch your eyes without washing your hands first.   Avoid kissing, oral sex,  and sharing personal items until sores heal.   Apply an ice pack on your sores for 10-15 minutes to ease any discomfort.   Avoid hot, cold, or salty foods because they may hurt your mouth. Eat a soft, bland diet to avoid irritating the sores. Use a straw to drink if you have pain when drinking out of a glass.   Keep sores clean and dry to prevent an infection of other tissues.   Avoid the sun and limit stress if these things trigger outbreaks. If sun causes cold sores, apply sunscreen on the lips before being out in the sun.   SEEK MEDICAL CARE IF:  You have a fever or persistent symptoms for more than 2-3 days.   You have a fever and your symptoms suddenly get worse.   You have pus, not clear fluid, coming from the sores.   You have redness that is spreading.   You have pain or irritation in your eye.   You get sores on your genitals.   Your sores do not heal within 2 weeks.   You have a weakened immune system.   You have frequent recurrences of cold sores.     MAKE SURE YOU  Understand these instructions. Will watch your condition. Will get help right away if you are not doing well or get worse.  Your e-visit answers were reviewed by a board certified advanced clinical practitioner to complete your personal care plan.    personal care plan.  Depending on the condition, your plan could have included both over the counter or prescription medications.  If there is a problem please reply  once you have received a response from your provider.  Your safety is important to us.  If you have drug allergies check your prescription carefully.    You can use MyChart to ask questions about today's visit, request a non-urgent call back, or ask for a work or school excuse.  You will get an e-mail in the next two days asking about your experience.  I hope that your e-visit has been valuable and will speed your recovery. Thank you for using e-visits.     

## 2016-02-24 ENCOUNTER — Other Ambulatory Visit: Payer: Self-pay | Admitting: Osteopathic Medicine

## 2016-02-24 DIAGNOSIS — Z13220 Encounter for screening for lipoid disorders: Secondary | ICD-10-CM | POA: Diagnosis not present

## 2016-02-24 DIAGNOSIS — Z0000 Encounter for general adult medical examination without abnormal findings: Secondary | ICD-10-CM | POA: Diagnosis not present

## 2016-02-24 DIAGNOSIS — R030 Elevated blood-pressure reading, without diagnosis of hypertension: Secondary | ICD-10-CM | POA: Diagnosis not present

## 2016-02-24 DIAGNOSIS — Z0189 Encounter for other specified special examinations: Secondary | ICD-10-CM | POA: Diagnosis not present

## 2016-02-24 DIAGNOSIS — Z1321 Encounter for screening for nutritional disorder: Secondary | ICD-10-CM | POA: Diagnosis not present

## 2016-02-24 DIAGNOSIS — M810 Age-related osteoporosis without current pathological fracture: Secondary | ICD-10-CM | POA: Diagnosis not present

## 2016-02-24 LAB — COMPLETE METABOLIC PANEL WITH GFR
ALT: 15 U/L (ref 6–29)
AST: 20 U/L (ref 10–35)
Albumin: 4.1 g/dL (ref 3.6–5.1)
Alkaline Phosphatase: 57 U/L (ref 33–130)
BILIRUBIN TOTAL: 0.5 mg/dL (ref 0.2–1.2)
BUN: 13 mg/dL (ref 7–25)
CHLORIDE: 100 mmol/L (ref 98–110)
CO2: 23 mmol/L (ref 20–31)
CREATININE: 0.65 mg/dL (ref 0.50–0.99)
Calcium: 9.4 mg/dL (ref 8.6–10.4)
GFR, Est Non African American: >89 mL/min (ref >=60)
Glucose, Bld: 109 mg/dL — ABNORMAL HIGH (ref 65–99)
Potassium: 5.2 mmol/L (ref 3.5–5.3)
Sodium: 135 mmol/L (ref 135–146)
TOTAL PROTEIN: 6.8 g/dL (ref 6.1–8.1)

## 2016-02-24 LAB — LIPID PANEL
Cholesterol: 194 mg/dL (ref <200)
HDL: 104 mg/dL (ref >50)
LDL CALC: 81 mg/dL (ref <100)
TRIGLYCERIDES: 46 mg/dL (ref <150)
Total CHOL/HDL Ratio: 1.9 Ratio (ref <5.0)
VLDL: 9 mg/dL (ref <30)

## 2016-02-24 LAB — CBC WITH DIFFERENTIAL/PLATELET
BASOS ABS: 64 {cells}/uL (ref 0–200)
Basophils Relative: 1 %
EOS ABS: 256 {cells}/uL (ref 15–500)
Eosinophils Relative: 4 %
HCT: 43.1 % (ref 35.0–45.0)
Hemoglobin: 14 g/dL (ref 11.7–15.5)
LYMPHS PCT: 36 %
Lymphs Abs: 2304 cells/uL (ref 850–3900)
MCH: 30.4 pg (ref 27.0–33.0)
MCHC: 32.5 g/dL (ref 32.0–36.0)
MCV: 93.7 fL (ref 80.0–100.0)
MONOS PCT: 9 %
MPV: 8.7 fL (ref 7.5–12.5)
Monocytes Absolute: 576 cells/uL (ref 200–950)
Neutro Abs: 3200 cells/uL (ref 1500–7800)
Neutrophils Relative %: 50 %
PLATELETS: 324 10*3/uL (ref 140–400)
RBC: 4.6 MIL/uL (ref 3.80–5.10)
RDW: 13 % (ref 11.0–15.0)
WBC: 6.4 10*3/uL (ref 3.8–10.8)

## 2016-02-25 LAB — VITAMIN D 25 HYDROXY (VIT D DEFICIENCY, FRACTURES): Vit D, 25-Hydroxy: 93 ng/mL (ref 30–100)

## 2016-02-26 NOTE — Addendum Note (Signed)
Addended by: Maryla Morrow on: 02/26/2016 09:35 AM   Modules accepted: Orders

## 2016-02-27 ENCOUNTER — Ambulatory Visit: Payer: Medicare Other

## 2016-02-27 ENCOUNTER — Encounter: Payer: Self-pay | Admitting: Osteopathic Medicine

## 2016-02-27 ENCOUNTER — Ambulatory Visit: Payer: Medicare Other | Admitting: Osteopathic Medicine

## 2016-02-27 ENCOUNTER — Ambulatory Visit (INDEPENDENT_AMBULATORY_CARE_PROVIDER_SITE_OTHER): Payer: Medicare Other | Admitting: Osteopathic Medicine

## 2016-02-27 VITALS — BP 128/80 | HR 77 | Resp 16 | Ht 62.0 in | Wt 126.0 lb

## 2016-02-27 DIAGNOSIS — Z7189 Other specified counseling: Secondary | ICD-10-CM | POA: Diagnosis not present

## 2016-02-27 DIAGNOSIS — Z1239 Encounter for other screening for malignant neoplasm of breast: Secondary | ICD-10-CM

## 2016-02-27 DIAGNOSIS — R7301 Impaired fasting glucose: Secondary | ICD-10-CM

## 2016-02-27 DIAGNOSIS — Z23 Encounter for immunization: Secondary | ICD-10-CM

## 2016-02-27 DIAGNOSIS — M810 Age-related osteoporosis without current pathological fracture: Secondary | ICD-10-CM

## 2016-02-27 DIAGNOSIS — Z1231 Encounter for screening mammogram for malignant neoplasm of breast: Secondary | ICD-10-CM

## 2016-02-27 DIAGNOSIS — E063 Autoimmune thyroiditis: Secondary | ICD-10-CM | POA: Insufficient documentation

## 2016-02-27 DIAGNOSIS — Z712 Person consulting for explanation of examination or test findings: Secondary | ICD-10-CM

## 2016-02-27 LAB — VITAMIN B12: VITAMIN B 12: 1515 pg/mL — AB (ref 200–1100)

## 2016-02-27 LAB — VITAMIN B6: Vitamin B6: 112.7 ng/mL — ABNORMAL HIGH (ref 2.1–21.7)

## 2016-02-27 MED ORDER — ZOSTER VAC RECOMB ADJUVANTED 50 MCG/0.5ML IM SUSR: 0.5000 mL | Freq: Once | INTRAMUSCULAR | 1 refills | Status: AC

## 2016-02-27 NOTE — Progress Notes (Unsigned)
Subjective:   Bailey Evans is a 66 y.o. female who presents for Medicare Annual (Subsequent) preventive examination.  Review of Systems:   Cardiac Risk Factors include: advanced age (>42men, >38 women);hypertension     Objective:     Vitals: BP 128/80   Pulse 77   Temp 97.9 F (36.6 C) (Oral)   Resp 16   Ht 5\' 2"  (1.575 m)   Wt 126 lb 14.4 oz (57.6 kg)   SpO2 98%   BMI 23.21 kg/m   Body mass index is 23.21 kg/m.   Tobacco History  Smoking Status  . Never Smoker  Smokeless Tobacco  . Never Used     Counseling given: Not Answered   Past Medical History:  Diagnosis Date  . Hypertension    Past Surgical History:  Procedure Laterality Date  . EYE SURGERY     Family History  Problem Relation Age of Onset  . Cancer Mother   . Heart attack Father   . Heart disease Paternal Uncle   . Cancer Maternal Grandmother   . Heart disease Paternal Uncle   . Heart disease Paternal Uncle    History  Sexual Activity  . Sexual activity: No    Outpatient Encounter Prescriptions as of 02/27/2016  Medication Sig  . Calcium-Vitamin D 600-200 MG-UNIT tablet Take by mouth.  . losartan (COZAAR) 25 MG tablet Take 1 tablet (25 mg total) by mouth daily.  . Multiple Vitamin (MULTI-VITAMINS) TABS Take by mouth.  . Omega-3 Fatty Acids (FISH OIL PO) Take 1 capsule by mouth 2 (two) times daily.   . Flaxseed, Linseed, (FLAXSEED OIL) 1000 MG CAPS Take by mouth.  . losartan (COZAAR) 25 MG tablet Take 1 tablet (25 mg total) by mouth daily. APPOINTMENT NEEDED FOR FURTHER REFILLS  . valACYclovir (VALTREX) 1000 MG tablet Take 2 tablets (2,000 mg total) by mouth 2 (two) times daily.   No facility-administered encounter medications on file as of 02/27/2016.     Activities of Daily Living In your present state of health, do you have any difficulty performing the following activities: 02/27/2016  Hearing? N  Vision? N  Difficulty concentrating or making decisions? N  Walking or climbing  stairs? N  Dressing or bathing? N  Doing errands, shopping? N  Using the Toilet? N  In the past six months, have you accidently leaked urine? N  Do you have problems with loss of bowel control? N  Managing your Medications? N  Managing your Finances? N  Housekeeping or managing your Housekeeping? N  Some recent data might be hidden    Patient Care Team: Emeterio Reeve, DO as PCP - General (Osteopathic Medicine)    Assessment:     Exercise Activities and Dietary recommendations Current Exercise Habits: Home exercise routine (walks in park or uses eliptical.), Type of exercise: walking, Time (Minutes): 30, Frequency (Times/Week): 4, Weekly Exercise (Minutes/Week): 120, Intensity: Mild, Exercise limited by: None identified  Goals      Patient Stated   . Eat more fruits and vegetables (pt-stated)          Pt. Would like to eat more fruits and vegs q day for the next year.      Fall Risk Fall Risk  02/27/2016  Falls in the past year? Yes  Injury with Fall? Yes   Depression Screen PHQ 2/9 Scores 02/27/2016  PHQ - 2 Score 0     Cognitive Function     6CIT Screen 02/27/2016  What Year? 0 points  What month? 0 points  What time? 0 points  Count back from 20 0 points  Months in reverse 0 points  Repeat phrase 0 points  Total Score 0    Immunization History  Administered Date(s) Administered  . Influenza,inj,Quad PF,36+ Mos 11/07/2015  . Influenza-Unspecified 11/17/2014   Screening Tests Health Maintenance  Topic Date Due  . TETANUS/TDAP  02/13/1967  . MAMMOGRAM  02/12/1998  . COLONOSCOPY  02/12/1998  . ZOSTAVAX  02/13/2008  . DEXA SCAN  02/12/2013  . PNA vac Low Risk Adult (1 of 2 - PCV13) 02/12/2013  . INFLUENZA VACCINE  Completed  . Hepatitis C Screening  Completed      Plan:    During the course of the visit the patient was educated and counseled about the following appropriate screening and preventive services:   Vaccines to include Pneumoccal,  Influenza, Hepatitis B, Td, Zostavax, HCV  Cardiovascular Disease  Colorectal cancer screening  Bone density screening  Diabetes screening  Mammography/PAP  Nutrition counseling   Patient Instructions (the written plan) was given to the patient.   Nestor Lewandowsky, RN  02/27/2016

## 2016-02-27 NOTE — Progress Notes (Signed)
HPI: Bailey Evans is a 66 y.o. female  who presents to [place] today, 02/27/16,  for chief complaint of:  Chief Complaint  Patient presents with  . Follow-up    Patient just had annual wellness visit with RN. Is here to discuss lab results  Lab results were reviewed in detail with the patient. No major concerns.  Past medical history, surgical history, social history and family history reviewed.  Patient Active Problem List   Diagnosis Date Noted  . Elevated blood pressure reading 07/16/2015  . History of pneumonia 04/16/2015  . Right leg pain 04/02/2015  . Osteoporosis 04/02/2015  . Postmenopausal 04/02/2015    Current medication list and allergy/intolerance information reviewed.   Current Outpatient Prescriptions on File Prior to Visit  Medication Sig Dispense Refill  . Calcium-Vitamin D 600-200 MG-UNIT tablet Take by mouth.    . Flaxseed, Linseed, (FLAXSEED OIL) 1000 MG CAPS Take by mouth.    . losartan (COZAAR) 25 MG tablet Take 1 tablet (25 mg total) by mouth daily. APPOINTMENT NEEDED FOR FURTHER REFILLS 30 tablet 0  . losartan (COZAAR) 25 MG tablet Take 1 tablet (25 mg total) by mouth daily. 90 tablet 3  . Multiple Vitamin (MULTI-VITAMINS) TABS Take by mouth.    . Omega-3 Fatty Acids (FISH OIL PO) Take 1 capsule by mouth 2 (two) times daily.     . valACYclovir (VALTREX) 1000 MG tablet Take 2 tablets (2,000 mg total) by mouth 2 (two) times daily. 4 tablet 0   No current facility-administered medications on file prior to visit.    Allergies  Allergen Reactions  . Penicillin G Rash      Review of Systems:  Constitutional: No recent illness  HEENT: No  headache, no vision change  Cardiac: No  chest pain, No  pressure, No palpitations  Respiratory:  No  shortness of breath. No  Cough  Gastrointestinal: No  abdominal pain, no change on bowel habits  Musculoskeletal: No new myalgia/arthralgia  Skin: No  Rash  Hem/Onc: No   easy bruising/bleeding, No  abnormal lumps/bumps  Neurologic: No  weakness, No  Dizziness  Psychiatric: No  concerns with depression, No  concerns with anxiety  Exam:  BP 128/80   Pulse 77   Resp 16   Ht 5' 2"  (1.575 m)   Wt 126 lb (57.2 kg)   BMI 23.05 kg/m   Constitutional: VS see above. General Appearance: alert, well-developed, well-nourished, NAD  Eyes: Normal lids and conjunctive, non-icteric sclera  Ears, Nose, Mouth, Throat: MMM, Normal external inspection ears/nares/mouth/lips/gums.  Neck: No masses, trachea midline.   Respiratory: Normal respiratory effort. no wheeze, no rhonchi, no rales  Cardiovascular: S1/S2 normal, no murmur, no rub/gallop auscultated. RRR.   Musculoskeletal: Gait normal. Symmetric and independent movement of all extremities  Neurological: Normal balance/coordination. No tremor.  Skin: warm, dry, intact.   Psychiatric: Normal judgment/insight. Normal mood and affect. Oriented x3.    Recent Results (from the past 2160 hour(s))  CBC with Differential/Platelet     Status: None   Collection Time: 02/24/16 10:26 AM  Result Value Ref Range   WBC 6.4 3.8 - 10.8 K/uL   RBC 4.60 3.80 - 5.10 MIL/uL   Hemoglobin 14.0 11.7 - 15.5 g/dL   HCT 43.1 35.0 - 45.0 %   MCV 93.7 80.0 - 100.0 fL   MCH 30.4 27.0 - 33.0 pg   MCHC 32.5 32.0 - 36.0 g/dL   RDW 13.0 11.0 - 15.0 %  Platelets 324 140 - 400 K/uL   MPV 8.7 7.5 - 12.5 fL   Neutro Abs 3,200 1,500 - 7,800 cells/uL   Lymphs Abs 2,304 850 - 3,900 cells/uL   Monocytes Absolute 576 200 - 950 cells/uL   Eosinophils Absolute 256 15 - 500 cells/uL   Basophils Absolute 64 0 - 200 cells/uL   Neutrophils Relative % 50 %   Lymphocytes Relative 36 %   Monocytes Relative 9 %   Eosinophils Relative 4 %   Basophils Relative 1 %   Smear Review Criteria for review not met   COMPLETE METABOLIC PANEL WITH GFR     Status: Abnormal   Collection Time: 02/24/16 10:26 AM  Result Value Ref Range   Sodium 135 135  - 146 mmol/L   Potassium 5.2 3.5 - 5.3 mmol/L   Chloride 100 98 - 110 mmol/L   CO2 23 20 - 31 mmol/L   Glucose, Bld 109 (H) 65 - 99 mg/dL   BUN 13 7 - 25 mg/dL   Creat 0.65 0.50 - 0.99 mg/dL    Comment:   For patients > or = 68 years of age: The upper reference limit for Creatinine is approximately 13% higher for people identified as African-American.      Total Bilirubin 0.5 0.2 - 1.2 mg/dL   Alkaline Phosphatase 57 33 - 130 U/L   AST 20 10 - 35 U/L   ALT 15 6 - 29 U/L   Total Protein 6.8 6.1 - 8.1 g/dL   Albumin 4.1 3.6 - 5.1 g/dL   Calcium 9.4 8.6 - 10.4 mg/dL   GFR, Est African American >89 >=60 mL/min   GFR, Est Non African American >89 >=60 mL/min  Lipid panel     Status: None   Collection Time: 02/24/16 10:26 AM  Result Value Ref Range   Cholesterol 194 <200 mg/dL   Triglycerides 46 <150 mg/dL   HDL 104 >50 mg/dL   Total CHOL/HDL Ratio 1.9 <5.0 Ratio   VLDL 9 <30 mg/dL   LDL Cholesterol 81 <100 mg/dL  VITAMIN D 25 Hydroxy (Vit-D Deficiency, Fractures)     Status: None   Collection Time: 02/24/16 10:26 AM  Result Value Ref Range   Vit D, 25-Hydroxy 93 30 - 100 ng/mL    Comment: Vitamin D Status           25-OH Vitamin D        Deficiency                <20 ng/mL        Insufficiency         20 - 29 ng/mL        Optimal             > or = 30 ng/mL   For 25-OH Vitamin D testing on patients on D2-supplementation and patients for whom quantitation of D2 and D3 fractions is required, the QuestAssureD 25-OH VIT D, (D2,D3), LC/MS/MS is recommended: order code [phone] (patients > 2 yrs).   Vitamin B6     Status: Abnormal   Collection Time: 02/24/16 10:26 AM  Result Value Ref Range   Vitamin B6 112.7 (H) 2.1 - 21.7 ng/mL    Comment: Vitamin supplementation within 24 hours prior to blood draw may affect the accuracy of the results. This test was developed and its analytical performance characteristics have been determined by [place], [place]. It has not been cleared or approved by the  U.S. Food and Drug Administration. This assay has been validated pursuant to the CLIA regulations and is used for clinical purposes.   Hemoglobin A1c     Status: None (Preliminary result)   Collection Time: 02/24/16 10:26 AM  Result Value Ref Range   Hgb A1c MFr Bld  <5.7 %   Mean Plasma Glucose  mg/dL  Vitamin B12     Status: Abnormal   Collection Time: 02/26/16  9:35 AM  Result Value Ref Range   Vitamin B-12 1,515 (H) 200 - 1,100 pg/mL    No results found.  No flowsheet data found.  Depression screen PHQ 2/9 02/27/2016  Decreased Interest 0  Down, Depressed, Hopeless 0  PHQ - 2 Score 0     FEMALE PREVENTIVE CARE Updated 02/27/16   ANNUAL SCREENING/COUNSELING  Diet/Exercise - HEALTHY HABITS DISCUSSED TO DECREASE CV RISK History  Smoking Status  . Never Smoker  Smokeless Tobacco  . Never Used   History  Alcohol Use  . 3.0 oz/week  . 5 Glasses of wine per week    Comment: Typically will have a glass of red wine in the evenings.   Depression screen [facility] 2/9 02/27/2016  Decreased Interest 0  Down, Depressed, Hopeless 0  PHQ - 2 Score 0    Domestic violence concerns - no  HTN SCREENING - SEE VITALS  INFECTIOUS DISEASE SCREENING  HIV - does not need  GC/CT - does not need  HepC - DOB [birth year] - does not need  TB - does not need  DISEASE SCREENING  Lipid - does not need  DM2 - does not need  Osteoporosis - women age 42+ - needs  CANCER SCREENING  Cervical - does not need  Breast - needs  Lung - does not need  Colon - does not need - digestive health specialists, normal per patient - need records  ADULT VACCINATION  Influenza - annual vaccine recommended  Td - booster every 10 years, not sure when last one was   Zoster - option at 79, yes at 60+   PCV13 - was not indicated  PPSV23 - was given Immunization History  Administered Date(s) Administered  . Influenza,inj,Quad  PF,36+ Mos 11/07/2015  . Influenza-Unspecified 11/17/2014  . Pneumococcal Conjugate-13 11/01/2014    OTHER  Fall - exercise and Vit D age 90+ - does not need  Consider ASA - age 67-59 - does not need     ASSESSMENT/PLAN:   Encounter to discuss test results  Screening for breast cancer - Plan: MM DIGITAL SCREENING BILATERAL  Osteoporosis without current pathological fracture, unspecified osteoporosis type  Need for 23-polyvalent pneumococcal polysaccharide vaccine - Plan: Pneumococcal polysaccharide vaccine 23-valent greater than or equal to 2yo subcutaneous/IM  Elevated fasting glucose - A1c currently pending    Follow-up plan: Return in about 6 months (around 08/26/2016) for BLOOD PRESSURE FOLLOW-UP.  Visit summary with medication list and pertinent instructions WAS printed for patient to review, alert [place] if any changes needed. All questions at time of visit were answered - patient instructed to contact office with any additional concerns. ER/RTC precautions were reviewed with the patient and understanding verbalized.   Note: Total time spent 15 minutes, greater than 50% of the visit was spent face-to-face counseling and coordinating care for the following: The primary encounter diagnosis was Encounter to discuss test results. Diagnoses of Screening for breast cancer, Osteoporosis without current pathological fracture, unspecified osteoporosis type, Need for 23-polyvalent pneumococcal polysaccharide vaccine, and Elevated fasting glucose were also pertinent  to this visit.Marland Kitchen

## 2016-02-28 LAB — HEMOGLOBIN A1C
HEMOGLOBIN A1C: 4.9 % (ref <5.7)
Mean Plasma Glucose: 94 mg/dL

## 2016-03-02 ENCOUNTER — Telehealth: Payer: Self-pay

## 2016-03-02 ENCOUNTER — Encounter: Payer: Self-pay | Admitting: Osteopathic Medicine

## 2016-03-02 DIAGNOSIS — E039 Hypothyroidism, unspecified: Secondary | ICD-10-CM

## 2016-03-02 NOTE — Telephone Encounter (Signed)
Cough can happen in 17% of patients on this medication, nasal congestion can happen in about 2%. She of course has the option to stop this medicine but she would need to follow-up in the office for recheck of blood pressure and to start alternative blood pressure medication

## 2016-03-02 NOTE — Telephone Encounter (Signed)
Patient called stated that she has had a cough and runny nose for about 2 months now and she though that it was coming from allergies, after reading the side effercts on the Losartan she stated that she thinks it may be coming from that. She wants to know if she should quit taking the Losartan for now. Please advise if patient needs to come in for an office visit. Kymber Kosar,CMA

## 2016-03-02 NOTE — Telephone Encounter (Signed)
Patient stated that she will stay on it for this week. She stated that she is having some problems with her thyroid so she is requesting a order to get her labs thyroid checked. Please advise. Lille Karim,CMA

## 2016-03-02 NOTE — Telephone Encounter (Signed)
Patient has been advised. Jacinta Penalver,CMA  

## 2016-03-02 NOTE — Telephone Encounter (Signed)
Fine, I placed order for thyroid labs but she needs to come into discuss her issues if the labs are normal and she is still having symptoms

## 2016-03-03 NOTE — Telephone Encounter (Signed)
SPOKE TO PATIENT GAVE HER RESULTS AS NOTED BELOW. [name],CMA  

## 2016-03-05 DIAGNOSIS — E039 Hypothyroidism, unspecified: Secondary | ICD-10-CM | POA: Diagnosis not present

## 2016-03-06 LAB — THYROID PANEL WITH TSH
FREE THYROXINE INDEX: 2.2 (ref 1.4–3.8)
T3 Uptake: 34 % (ref 22–35)
T4 TOTAL: 6.6 ug/dL (ref 4.5–12.0)
TSH: 1.91 m[IU]/L

## 2016-03-11 ENCOUNTER — Encounter: Payer: Self-pay | Admitting: Family Medicine

## 2016-03-11 ENCOUNTER — Ambulatory Visit (INDEPENDENT_AMBULATORY_CARE_PROVIDER_SITE_OTHER): Payer: Medicare Other | Admitting: Family Medicine

## 2016-03-11 VITALS — BP 164/83 | HR 94 | Temp 98.0°F | Wt 125.0 lb

## 2016-03-11 DIAGNOSIS — R030 Elevated blood-pressure reading, without diagnosis of hypertension: Secondary | ICD-10-CM

## 2016-03-11 DIAGNOSIS — J111 Influenza due to unidentified influenza virus with other respiratory manifestations: Secondary | ICD-10-CM | POA: Diagnosis not present

## 2016-03-11 DIAGNOSIS — R42 Dizziness and giddiness: Secondary | ICD-10-CM | POA: Diagnosis not present

## 2016-03-11 MED ORDER — GUAIFENESIN-CODEINE 100-10 MG/5ML PO SOLN: 5.0000 mL | Freq: Every evening | ORAL | 0 refills | Status: DC | PRN

## 2016-03-11 MED ORDER — BENZONATATE 200 MG PO CAPS: 200.0000 mg | ORAL_CAPSULE | Freq: Three times a day (TID) | ORAL | 0 refills | Status: DC | PRN

## 2016-03-11 MED ORDER — OSELTAMIVIR PHOSPHATE 75 MG PO CAPS: 75.0000 mg | ORAL_CAPSULE | Freq: Two times a day (BID) | ORAL | 0 refills | Status: DC

## 2016-03-11 NOTE — Patient Instructions (Signed)
Thank you for coming in today. Take tamiflu.  Take the cough medicine as needed.  Take tylenol for pain and fever. Be careful because  Some combo cold and flu medicines over the counter contain tylenol (Aceminophen).  Be sure to not take more than 1000mg  every 6 hours.   Return as needed.    Influenza, Adult Influenza, more commonly known as "the flu," is a viral infection that primarily affects the respiratory tract. The respiratory tract includes organs that help you breathe, such as the lungs, nose, and throat. The flu causes many common cold symptoms, as well as a high fever and body aches. The flu spreads easily from person to person (is contagious). Getting a flu shot (influenza vaccination) every year is the best way to prevent influenza. What are the causes? Influenza is caused by a virus. You can catch the virus by:  Breathing in droplets from an infected person's cough or sneeze.  Touching something that was recently contaminated with the virus and then touching your mouth, nose, or eyes. What increases the risk? The following factors may make you more likely to get the flu:  Not cleaning your hands frequently with soap and water or alcohol-based hand sanitizer.  Having close contact with many people during cold and flu season.  Touching your mouth, eyes, or nose without washing or sanitizing your hands first.  Not drinking enough fluids or not eating a healthy diet.  Not getting enough sleep or exercise.  Being under a high amount of stress.  Not getting a yearly (annual) flu shot. You may be at a higher risk of complications from the flu, such as a severe lung infection (pneumonia), if you:  Are over the age of 61.  Are pregnant.  Have a weakened disease-fighting system (immune system). You may have a weakened immune system if you:  Have HIV or AIDS.  Are undergoing chemotherapy.  Aretaking medicines that reduce the activity of (suppress) the immune  system.  Have a long-term (chronic) illness, such as heart disease, kidney disease, diabetes, or lung disease.  Have a liver disorder.  Are obese.  Have anemia. What are the signs or symptoms? Symptoms of this condition typically last 4-10 days and may include:  Fever.  Chills.  Headache, body aches, or muscle aches.  Sore throat.  Cough.  Runny or congested nose.  Chest discomfort and cough.  Poor appetite.  Weakness or tiredness (fatigue).  Dizziness.  Nausea or vomiting. How is this diagnosed? This condition may be diagnosed based on your medical history and a physical exam. Your health care provider may do a nose or throat swab test to confirm the diagnosis. How is this treated? If influenza is detected early, you can be treated with antiviral medicine that can reduce the length of your illness and the severity of your symptoms. This medicine may be given by mouth (orally) or through an IV tube that is inserted in one of your veins. The goal of treatment is to relieve symptoms by taking care of yourself at home. This may include taking over-the-counter medicines, drinking plenty of fluids, and adding humidity to the air in your home. In some cases, influenza goes away on its own. Severe influenza or complications from influenza may be treated in a hospital. Follow these instructions at home:  Take over-the-counter and prescription medicines only as told by your health care provider.  Use a cool mist humidifier to add humidity to the air in your home. This can make  breathing easier.  Rest as needed.  Drink enough fluid to keep your urine clear or pale yellow.  Cover your mouth and nose when you cough or sneeze.  Wash your hands with soap and water often, especially after you cough or sneeze. If soap and water are not available, use hand sanitizer.  Stay home from work or school as told by your health care provider. Unless you are visiting your health care  provider, try to avoid leaving home until your fever has been gone for 24 hours without the use of medicine.  Keep all follow-up visits as told by your health care provider. This is important. How is this prevented?  Getting an annual flu shot is the best way to avoid getting the flu. You may get the flu shot in late summer, fall, or winter. Ask your health care provider when you should get your flu shot.  Wash your hands often or use hand sanitizer often.  Avoid contact with people who are sick during cold and flu season.  Eat a healthy diet, drink plenty of fluids, get enough sleep, and exercise regularly. Contact a health care provider if:  You develop new symptoms.  You have:  Chest pain.  Diarrhea.  A fever.  Your cough gets worse.  You produce more mucus.  You feel nauseous or you vomit. Get help right away if:  You develop shortness of breath or difficulty breathing.  Your skin or nails turn a bluish color.  You have severe pain or stiffness in your neck.  You develop a sudden headache or sudden pain in your face or ear.  You cannot stop vomiting. This information is not intended to replace advice given to you by your health care provider. Make sure you discuss any questions you have with your health care provider. Document Released: 01/17/2000 Document Revised: 06/27/2015 Document Reviewed: 11/13/2014 Elsevier Interactive Patient Education  2017 [employer].

## 2016-03-11 NOTE — Progress Notes (Signed)
Bailey Evans is a 66 y.o. female who presents to [place]: [place] today for cough congestion. Symptoms started this evening. Patient has been exposed to multiple family members with influenza. She carries one episode of room spinning vertigo. This lasted about 30 seconds. She's feeling well now with no more episodes of vertigo. She denies any weakness or numbness or loss of function. She notes that she has not been taking her blood pressure medication because her home blood pressures are typically normal. She's not tried any medications for her symptoms.   Past Medical History:  Diagnosis Date  . Hypertension    Past Surgical History:  Procedure Laterality Date  . EYE SURGERY     Social History  Substance Use Topics  . Smoking status: Never Smoker  . Smokeless tobacco: Never Used  . Alcohol use 3.0 oz/week    5 Glasses of wine per week     Comment: Typically will have a glass of red wine in the evenings.   family history includes Cancer in her maternal grandmother and mother; Heart attack in her father; Heart disease in her paternal uncle, paternal uncle, and paternal uncle.  ROS as above:  Medications: Current Outpatient Prescriptions  Medication Sig Dispense Refill  . Calcium-Vitamin D 600-200 MG-UNIT tablet Take by mouth.    . Flaxseed, Linseed, (FLAXSEED OIL) 1000 MG CAPS Take by mouth.    . losartan (COZAAR) 25 MG tablet Take 1 tablet (25 mg total) by mouth daily. APPOINTMENT NEEDED FOR FURTHER REFILLS 30 tablet 0  . Multiple Vitamin (MULTI-VITAMINS) TABS Take by mouth.    . Omega-3 Fatty Acids (FISH OIL PO) Take 1 capsule by mouth 2 (two) times daily.     . valACYclovir (VALTREX) 1000 MG tablet Take 2 tablets (2,000 mg total) by mouth 2 (two) times daily. 4 tablet 0  . benzonatate (TESSALON) 200 MG capsule Take 1 capsule (200 mg total) by mouth 3 (three) times  daily as needed for cough. 45 capsule 0  . guaiFENesin-codeine 100-10 MG/5ML syrup Take 5 mLs by mouth at bedtime as needed for cough. 120 mL 0  . oseltamivir (TAMIFLU) 75 MG capsule Take 1 capsule (75 mg total) by mouth 2 (two) times daily. 10 capsule 0   No current facility-administered medications for this visit.    Allergies  Allergen Reactions  . Penicillin G Rash    Health Maintenance Health Maintenance  Topic Date Due  . MAMMOGRAM  02/12/1998  . COLONOSCOPY  02/12/1998  . ZOSTAVAX  02/13/2008  . DEXA SCAN  02/12/2013  . TETANUS/TDAP  02/02/2018 (Originally 02/13/1969)  . INFLUENZA VACCINE  Completed  . Hepatitis C Screening  Completed  . PNA vac Low Risk Adult  Completed     Exam:  BP (!) 164/83   Pulse 94   Temp 98 F (36.7 C) (Oral)   Wt 125 lb (56.7 kg)   SpO2 98%   BMI 22.86 kg/m  Gen: Well NAD Nontoxic appearing HEENT: EOMI,  MMM clear nasal discharge. Lungs: Normal work of breathing. CTABL Heart: RRR no MRG Abd: NABS, Soft. Nondistended, Nontender Exts: Brisk capillary refill, warm and well perfused.  Neuro alert and oriented normal coordination balance and gait. No visible nystagmus.   No results found for this or any previous visit (from the past 72 hour(s)). No results found.    Assessment and Plan: 66 y.o. female with  Viral URI likely influenza. Treat empirically with Tamiflu.  Use codeine cough syrup and Tessalon Perles for cough. Recommend Tylenol for symptom control as well.  Vertigo. Patient appears to be asymptomatic currently. I think it probably was BPPV.  Elevated blood pressure: Follow-up with PCP. Keep home blood pressure log and bring blood pressure meter to her next follow-up visit  No orders of the defined types were placed in this encounter.  Meds ordered this encounter  Medications  . oseltamivir (TAMIFLU) 75 MG capsule    Sig: Take 1 capsule (75 mg total) by mouth 2 (two) times daily.    Dispense:  10 capsule    Refill:  0   . guaiFENesin-codeine 100-10 MG/5ML syrup    Sig: Take 5 mLs by mouth at bedtime as needed for cough.    Dispense:  120 mL    Refill:  0  . benzonatate (TESSALON) 200 MG capsule    Sig: Take 1 capsule (200 mg total) by mouth 3 (three) times daily as needed for cough.    Dispense:  45 capsule    Refill:  0     Discussed warning signs or symptoms. Please see discharge instructions. Patient expresses understanding.

## 2016-03-12 ENCOUNTER — Ambulatory Visit: Payer: Medicare Other

## 2016-03-13 ENCOUNTER — Telehealth: Payer: Self-pay

## 2016-03-13 MED ORDER — AZITHROMYCIN 250 MG PO TABS: 250.0000 mg | ORAL_TABLET | Freq: Every day | ORAL | 0 refills | Status: DC

## 2016-03-13 NOTE — Telephone Encounter (Signed)
Patient advised.

## 2016-03-13 NOTE — Telephone Encounter (Signed)
Azithromycin sent in.  Please return if not better or if worse.

## 2016-03-13 NOTE — Telephone Encounter (Signed)
Bailey Evans called and states the cough has gotten worse. She would like an antibiotic sent in. Please advise.

## 2016-03-27 DIAGNOSIS — H52223 Regular astigmatism, bilateral: Secondary | ICD-10-CM | POA: Diagnosis not present

## 2016-03-27 DIAGNOSIS — H43813 Vitreous degeneration, bilateral: Secondary | ICD-10-CM | POA: Diagnosis not present

## 2016-03-27 DIAGNOSIS — H524 Presbyopia: Secondary | ICD-10-CM | POA: Diagnosis not present

## 2016-03-27 DIAGNOSIS — H16223 Keratoconjunctivitis sicca, not specified as Sjogren's, bilateral: Secondary | ICD-10-CM | POA: Diagnosis not present

## 2016-03-27 DIAGNOSIS — L718 Other rosacea: Secondary | ICD-10-CM | POA: Diagnosis not present

## 2016-03-27 DIAGNOSIS — H04129 Dry eye syndrome of unspecified lacrimal gland: Secondary | ICD-10-CM | POA: Diagnosis not present

## 2016-03-27 DIAGNOSIS — H5213 Myopia, bilateral: Secondary | ICD-10-CM | POA: Diagnosis not present

## 2016-04-20 ENCOUNTER — Ambulatory Visit (INDEPENDENT_AMBULATORY_CARE_PROVIDER_SITE_OTHER): Payer: Medicare Other

## 2016-04-20 ENCOUNTER — Ambulatory Visit (INDEPENDENT_AMBULATORY_CARE_PROVIDER_SITE_OTHER): Payer: Medicare Other | Admitting: Osteopathic Medicine

## 2016-04-20 ENCOUNTER — Encounter: Payer: Self-pay | Admitting: Osteopathic Medicine

## 2016-04-20 VITALS — BP 133/74 | HR 80 | Ht 62.0 in | Wt 125.0 lb

## 2016-04-20 DIAGNOSIS — M8580 Other specified disorders of bone density and structure, unspecified site: Secondary | ICD-10-CM

## 2016-04-20 DIAGNOSIS — M169 Osteoarthritis of hip, unspecified: Secondary | ICD-10-CM | POA: Diagnosis not present

## 2016-04-20 DIAGNOSIS — M533 Sacrococcygeal disorders, not elsewhere classified: Secondary | ICD-10-CM | POA: Diagnosis not present

## 2016-04-20 DIAGNOSIS — S76311A Strain of muscle, fascia and tendon of the posterior muscle group at thigh level, right thigh, initial encounter: Secondary | ICD-10-CM

## 2016-04-20 MED ORDER — MELOXICAM 7.5 MG PO TABS: 7.5000 mg | ORAL_TABLET | Freq: Every day | ORAL | 0 refills | Status: DC

## 2016-04-20 NOTE — Patient Instructions (Signed)
Plan:  Xray today  Antiinflammatories: meloxicam instead of ibuprofen   Home exercises and will plan for physical therapy if no better  Plan for follow-up with Sport Medicine (Dr. Georgina Snell or Dr. Darene Lamer.) if no better with PT

## 2016-04-20 NOTE — Progress Notes (Signed)
HPI: Bailey Evans is a 66 y.o. female  who presents to [place] today, 04/20/16,  for chief complaint of:  Chief Complaint  Patient presents with  . Back Pain    Low Back Pain . Location: SI joint on R and radiating across buttock . Quality: sore, cramping at times . Severity: better than few weeks ago but lingering  . Duration: 4 weeks   Past medical history, surgical history, social history and family history reviewed.  Patient Active Problem List   Diagnosis Date Noted  . Hashimoto's thyroiditis 02/27/2016  . Elevated blood pressure reading 07/16/2015  . History of pneumonia 04/16/2015  . Right leg pain 04/02/2015  . Osteoporosis 04/02/2015  . Postmenopausal 04/02/2015  . Epiphora due to insufficient drainage, bilateral 01/07/2015  . Dry eye syndrome, bilateral 07/12/2014  . Obstructive chronic bronchitis ([place]) 02/03/2008    Current medication list and allergy/intolerance information reviewed.   Current Outpatient Prescriptions on File Prior to Visit  Medication Sig Dispense Refill  . Calcium-Vitamin D 600-200 MG-UNIT tablet Take by mouth.    . Flaxseed, Linseed, (FLAXSEED OIL) 1000 MG CAPS Take by mouth.    . losartan (COZAAR) 25 MG tablet Take 1 tablet (25 mg total) by mouth daily. APPOINTMENT NEEDED FOR FURTHER REFILLS 30 tablet 0  . Multiple Vitamin (MULTI-VITAMINS) TABS Take by mouth.    . Omega-3 Fatty Acids (FISH OIL PO) Take 1 capsule by mouth 2 (two) times daily.     . valACYclovir (VALTREX) 1000 MG tablet Take 2 tablets (2,000 mg total) by mouth 2 (two) times daily. 4 tablet 0   No current facility-administered medications on file prior to visit.    Allergies  Allergen Reactions  . Penicillin G Rash      Review of Systems:  Constitutional: No recent illness  Cardiac: No  chest pain  Respiratory:  No  shortness of breath  Gastrointestinal: No  abdominal pain, no change on bowel habits  Musculoskeletal: +new  myalgia/arthralgia  Skin: No  Rash  Neurologic: No  weakness, No  Dizziness   Exam:  BP 133/74   Pulse 80   Ht 5\' 2"  (1.575 m)   Wt 125 lb (56.7 kg)   BMI 22.86 kg/m   Constitutional: VS see above. General Appearance: alert, well-developed, well-nourished, NAD  Neck: No masses, trachea midline.   Respiratory: Normal respiratory effort.  Musculoskeletal: Gait normal. Symmetric and independent movement of all extremities. Straight leg raise negative bilateral. (+)tenderness to L piriformis and SI joint. Trendelenberg yields some discomfort on R but no weakness/pelvic tilt  Neurological: Normal balance/coordination. No tremor.  Skin: warm, dry, intact.   Psychiatric: Normal judgment/insight. Normal mood and affect. Oriented x3.   XR personally reviewed  Dg Pelvis 1-2 Views  Result Date: 04/21/2016 CLINICAL DATA:  Joint pain. EXAM: PELVIS - 1-2 VIEW COMPARISON:  No recent prior. FINDINGS: Degenerative changes lumbar spine and both hips. Diffuse osteopenia. No evidence of fracture. IMPRESSION: Diffuse osteopenia and degenerative change.  No acute abnormality. Electronically Signed   By: Marcello Moores  Register   On: 04/21/2016 06:42      ASSESSMENT/PLAN:   NSAID, home exercises, PT   OMT: MFR and HVLA to R SI joint   SI (sacroiliac) joint dysfunction - Plan: DG Pelvis 1-2 Views, Ambulatory referral to Physical Therapy  Strain of piriformis muscle, right, initial encounter    Patient Instructions  Plan:  Xray today  Antiinflammatories: meloxicam instead of ibuprofen   Home exercises and will  plan for physical therapy if no better  Plan for follow-up with Sport Medicine (Dr. Georgina Snell or Dr. Darene Lamer.) if no better with PT     Follow-up plan: Return if symptoms worsen or fail to improve in 2 weeks.  Visit summary with medication list and pertinent instructions was printed for patient to review, alert [place] if any changes needed. All questions at time of visit were answered -  patient instructed to contact office with any additional concerns. ER/RTC precautions were reviewed with the patient and understanding verbalized.

## 2016-04-23 ENCOUNTER — Other Ambulatory Visit: Payer: Medicare Other

## 2016-05-13 DIAGNOSIS — D2262 Melanocytic nevi of left upper limb, including shoulder: Secondary | ICD-10-CM | POA: Diagnosis not present

## 2016-05-13 DIAGNOSIS — D2272 Melanocytic nevi of left lower limb, including hip: Secondary | ICD-10-CM | POA: Diagnosis not present

## 2016-05-13 DIAGNOSIS — L218 Other seborrheic dermatitis: Secondary | ICD-10-CM | POA: Diagnosis not present

## 2016-05-13 DIAGNOSIS — D485 Neoplasm of uncertain behavior of skin: Secondary | ICD-10-CM | POA: Diagnosis not present

## 2016-05-13 DIAGNOSIS — D2261 Melanocytic nevi of right upper limb, including shoulder: Secondary | ICD-10-CM | POA: Diagnosis not present

## 2016-05-13 DIAGNOSIS — L814 Other melanin hyperpigmentation: Secondary | ICD-10-CM | POA: Diagnosis not present

## 2016-05-13 DIAGNOSIS — Z85828 Personal history of other malignant neoplasm of skin: Secondary | ICD-10-CM | POA: Diagnosis not present

## 2016-05-13 DIAGNOSIS — D225 Melanocytic nevi of trunk: Secondary | ICD-10-CM | POA: Diagnosis not present

## 2016-05-13 DIAGNOSIS — C44529 Squamous cell carcinoma of skin of other part of trunk: Secondary | ICD-10-CM | POA: Diagnosis not present

## 2016-05-13 DIAGNOSIS — D2271 Melanocytic nevi of right lower limb, including hip: Secondary | ICD-10-CM | POA: Diagnosis not present

## 2016-05-13 DIAGNOSIS — Z08 Encounter for follow-up examination after completed treatment for malignant neoplasm: Secondary | ICD-10-CM | POA: Diagnosis not present

## 2016-05-13 DIAGNOSIS — L570 Actinic keratosis: Secondary | ICD-10-CM | POA: Diagnosis not present

## 2016-05-13 DIAGNOSIS — L821 Other seborrheic keratosis: Secondary | ICD-10-CM | POA: Diagnosis not present

## 2016-06-10 DIAGNOSIS — L905 Scar conditions and fibrosis of skin: Secondary | ICD-10-CM | POA: Diagnosis not present

## 2016-06-10 DIAGNOSIS — C44529 Squamous cell carcinoma of skin of other part of trunk: Secondary | ICD-10-CM | POA: Diagnosis not present

## 2016-06-23 DIAGNOSIS — Z48817 Encounter for surgical aftercare following surgery on the skin and subcutaneous tissue: Secondary | ICD-10-CM | POA: Diagnosis not present

## 2016-06-23 DIAGNOSIS — L570 Actinic keratosis: Secondary | ICD-10-CM | POA: Diagnosis not present

## 2016-11-10 ENCOUNTER — Other Ambulatory Visit: Payer: Self-pay

## 2016-11-10 DIAGNOSIS — I10 Essential (primary) hypertension: Secondary | ICD-10-CM

## 2016-11-10 MED ORDER — LOSARTAN POTASSIUM 25 MG PO TABS: 25.0000 mg | ORAL_TABLET | Freq: Every day | ORAL | 0 refills | Status: DC

## 2016-11-10 NOTE — Telephone Encounter (Signed)
Patient request refill for Losartan. #[phone] refills sent to [facility]. Patient advised that appointment is needed for further refills. Britt Petroni,CMA

## 2016-11-16 DIAGNOSIS — L570 Actinic keratosis: Secondary | ICD-10-CM | POA: Diagnosis not present

## 2016-11-16 DIAGNOSIS — L578 Other skin changes due to chronic exposure to nonionizing radiation: Secondary | ICD-10-CM | POA: Diagnosis not present

## 2016-11-16 DIAGNOSIS — Z85828 Personal history of other malignant neoplasm of skin: Secondary | ICD-10-CM | POA: Diagnosis not present

## 2016-11-16 DIAGNOSIS — M71342 Other bursal cyst, left hand: Secondary | ICD-10-CM | POA: Diagnosis not present

## 2016-11-16 DIAGNOSIS — L814 Other melanin hyperpigmentation: Secondary | ICD-10-CM | POA: Diagnosis not present

## 2016-11-16 DIAGNOSIS — Z08 Encounter for follow-up examination after completed treatment for malignant neoplasm: Secondary | ICD-10-CM | POA: Diagnosis not present

## 2016-11-16 DIAGNOSIS — D2272 Melanocytic nevi of left lower limb, including hip: Secondary | ICD-10-CM | POA: Diagnosis not present

## 2016-11-16 DIAGNOSIS — D2262 Melanocytic nevi of left upper limb, including shoulder: Secondary | ICD-10-CM | POA: Diagnosis not present

## 2016-11-16 DIAGNOSIS — D225 Melanocytic nevi of trunk: Secondary | ICD-10-CM | POA: Diagnosis not present

## 2016-11-16 DIAGNOSIS — D2271 Melanocytic nevi of right lower limb, including hip: Secondary | ICD-10-CM | POA: Diagnosis not present

## 2016-11-16 DIAGNOSIS — L821 Other seborrheic keratosis: Secondary | ICD-10-CM | POA: Diagnosis not present

## 2016-11-16 DIAGNOSIS — D2261 Melanocytic nevi of right upper limb, including shoulder: Secondary | ICD-10-CM | POA: Diagnosis not present

## 2016-12-09 ENCOUNTER — Ambulatory Visit (INDEPENDENT_AMBULATORY_CARE_PROVIDER_SITE_OTHER): Payer: Medicare Other | Admitting: Osteopathic Medicine

## 2016-12-09 ENCOUNTER — Encounter: Payer: Self-pay | Admitting: Osteopathic Medicine

## 2016-12-09 ENCOUNTER — Ambulatory Visit: Payer: Self-pay | Admitting: Osteopathic Medicine

## 2016-12-09 VITALS — BP 133/83 | HR 71 | Temp 98.1°F | Resp 16 | Wt 125.5 lb

## 2016-12-09 DIAGNOSIS — R748 Abnormal levels of other serum enzymes: Secondary | ICD-10-CM | POA: Diagnosis not present

## 2016-12-09 DIAGNOSIS — H04223 Epiphora due to insufficient drainage, bilateral lacrimal glands: Secondary | ICD-10-CM

## 2016-12-09 DIAGNOSIS — I10 Essential (primary) hypertension: Secondary | ICD-10-CM | POA: Diagnosis not present

## 2016-12-09 DIAGNOSIS — Z23 Encounter for immunization: Secondary | ICD-10-CM

## 2016-12-09 MED ORDER — ZOSTER VAC RECOMB ADJUVANTED 50 MCG/0.5ML IM SUSR: 0.5000 mL | Freq: Once | INTRAMUSCULAR | 1 refills | Status: AC

## 2016-12-09 MED ORDER — LOSARTAN POTASSIUM 25 MG PO TABS: 25.0000 mg | ORAL_TABLET | Freq: Every day | ORAL | 3 refills | Status: DC

## 2016-12-09 NOTE — Progress Notes (Signed)
HPI: Bailey Evans is a 66 y.o. female who  has a past medical history of Hypertension.  she presents to [facility] today, 12/09/16,  for chief complaint of:  Chief Complaint  Patient presents with  . Hypertension    Follow up    HTN: BP looks good today! No home BP to report. No chest pain, pressure, SOB. Compliant with meds as below.   Dry eyes doing better, following with eye doctor  Needs flu shot today   Past medical history, surgical history, social history and family history reviewed.  Patient Active Problem List   Diagnosis Date Noted  . Hashimoto's thyroiditis 02/27/2016  . Elevated blood pressure reading 07/16/2015  . History of pneumonia 04/16/2015  . Right leg pain 04/02/2015  . Osteoporosis 04/02/2015  . Postmenopausal 04/02/2015  . Epiphora due to insufficient drainage, bilateral 01/07/2015  . Dry eye syndrome, bilateral 07/12/2014  . Obstructive chronic bronchitis ([place]) 02/03/2008    Current medication list and allergy/intolerance information reviewed.    Current Outpatient Medications on File Prior to Visit  Medication Sig Dispense Refill  . Calcium-Vitamin D 600-200 MG-UNIT tablet Take by mouth.    . Flaxseed, Linseed, (FLAXSEED OIL) 1000 MG CAPS Take by mouth.    . Multiple Vitamin (MULTI-VITAMINS) TABS Take by mouth.    . Omega-3 Fatty Acids (FISH OIL PO) Take 1 capsule by mouth 2 (two) times daily.      No current facility-administered medications on file prior to visit.    Allergies  Allergen Reactions  . Penicillin G Rash      Review of Systems:  Constitutional: No recent illness  HEENT: No  headache, no vision change  Cardiac: No  chest pain, No  pressure, No palpitations  Respiratory:  No  shortness of breath. No  Cough  Gastrointestinal: No  abdominal pain, no change on bowel habits  Musculoskeletal: No new myalgia/arthralgia  Skin: No  Rash  Hem/Onc: No  easy bruising/bleeding, No  abnormal  lumps/bumps  Neurologic: No  weakness, No  Dizziness  Psychiatric: No  concerns with depression, No  concerns with anxiety  Exam:  BP 133/83 (BP Location: Right Arm, Patient Position: Sitting, Cuff Size: Large)   Pulse 71   Temp 98.1 F (36.7 C) (Oral)   Resp 16   Wt 125 lb 8 oz (56.9 kg)   SpO2 97%   BMI 22.95 kg/m   Constitutional: VS see above. General Appearance: alert, well-developed, well-nourished, NAD  Eyes: Normal lids and conjunctive, non-icteric sclera  Ears, Nose, Mouth, Throat: MMM, Normal external inspection ears/nares/mouth/lips/gums.  Neck: No masses, trachea midline.   Respiratory: Normal respiratory effort. no wheeze, no rhonchi, no rales  Cardiovascular: S1/S2 normal, no murmur, no rub/gallop auscultated. RRR.   Musculoskeletal: Gait normal. Symmetric and independent movement of all extremities  Neurological: Normal balance/coordination. No tremor.  Skin: warm, dry, intact.   Psychiatric: Normal judgment/insight. Normal mood and affect. Oriented x3.     ASSESSMENT/PLAN:   Essential hypertension - Plan: losartan (COZAAR) 25 MG tablet, CBC, COMPLETE METABOLIC PANEL WITH GFR, Lipid panel, VITAMIN D 25 Hydroxy (Vit-D Deficiency, Fractures), TSH, T4, free  Flu vaccine need - Plan: Flu vaccine HIGH DOSE PF (Fluzone High dose)  Elevated vitamin B12 level - Plan: Vitamin B12  Epiphora due to insufficient drainage, bilateral  Need for immunization against influenza - Plan: Flu Vaccine QUAD 36+ mos IM  Need for shingles vaccine - Plan: Zoster Vaccine Adjuvanted [facility]) injection   Meds  ordered this encounter  Medications  . losartan (COZAAR) 25 MG tablet    Sig: Take 1 tablet (25 mg total) daily by mouth. APPOINTMENT NEEDED FOR FURTHER REFILLS    Dispense:  90 tablet    Refill:  3  . Zoster Vaccine Adjuvanted [facility]) injection    Sig: Inject 0.5 mLs once for 1 dose into the muscle. Repeat dose 2-6 months    Dispense:  0.5 mL    Refill:  1       Follow-up plan: Return in about 6 months (around 06/08/2017) for ANNUAL PHYSICAL AND BP FOLLOW-UP, sooner if needed.  Visit summary with medication list and pertinent instructions was printed for patient to review, alert [place] if any changes needed. All questions at time of visit were answered - patient instructed to contact office with any additional concerns. ER/RTC precautions were reviewed with the patient and understanding verbalized.   Note: Total time spent 15 minutes, greater than 50% of the visit was spent face-to-face counseling and coordinating care for the following: The primary encounter diagnosis was Essential hypertension. Diagnoses of Flu vaccine need, Elevated vitamin B12 level, Epiphora due to insufficient drainage, bilateral, Need for immunization against influenza, and Need for shingles vaccine were also pertinent to this visit.Marland Kitchen  Please note: voice recognition software was used to produce this document, and typos may escape review. Please contact Dr. Sheppard Coil for any needed clarifications.

## 2016-12-10 ENCOUNTER — Encounter: Payer: Self-pay | Admitting: Osteopathic Medicine

## 2017-01-13 ENCOUNTER — Telehealth: Payer: Self-pay

## 2017-01-13 DIAGNOSIS — Z0189 Encounter for other specified special examinations: Secondary | ICD-10-CM

## 2017-01-13 DIAGNOSIS — H04129 Dry eye syndrome of unspecified lacrimal gland: Secondary | ICD-10-CM

## 2017-01-13 NOTE — Telephone Encounter (Signed)
Patient called this afternoon requesting a lab order to check her vitamin A levels. As per patient, has had several visits with her eye doctor and was tested for eye disease / degeneration due to severe eye dryness. Per patient, all testing ordered by eye doctor were negative.

## 2017-01-14 DIAGNOSIS — H04129 Dry eye syndrome of unspecified lacrimal gland: Secondary | ICD-10-CM | POA: Insufficient documentation

## 2017-01-14 NOTE — Telephone Encounter (Signed)
Lab ordered Can come to get blood drawn at her convenience

## 2017-01-15 NOTE — Telephone Encounter (Signed)
Left a brief voicemail for patient regarding lab request.

## 2017-01-18 NOTE — Telephone Encounter (Signed)
Spoke to patient regarding lab request. Patient will stop by lab some time this wk.

## 2017-01-19 DIAGNOSIS — Z0189 Encounter for other specified special examinations: Secondary | ICD-10-CM | POA: Diagnosis not present

## 2017-01-19 DIAGNOSIS — H04129 Dry eye syndrome of unspecified lacrimal gland: Secondary | ICD-10-CM | POA: Diagnosis not present

## 2017-01-24 LAB — VITAMIN A: VITAMIN A (RETINOIC ACID): 57 ug/dL (ref 38–98)

## 2017-03-22 DIAGNOSIS — H16213 Exposure keratoconjunctivitis, bilateral: Secondary | ICD-10-CM | POA: Diagnosis not present

## 2017-03-22 DIAGNOSIS — H25013 Cortical age-related cataract, bilateral: Secondary | ICD-10-CM | POA: Diagnosis not present

## 2017-03-22 DIAGNOSIS — H04123 Dry eye syndrome of bilateral lacrimal glands: Secondary | ICD-10-CM | POA: Diagnosis not present

## 2017-03-22 DIAGNOSIS — H2513 Age-related nuclear cataract, bilateral: Secondary | ICD-10-CM | POA: Diagnosis not present

## 2017-04-07 DIAGNOSIS — L814 Other melanin hyperpigmentation: Secondary | ICD-10-CM | POA: Diagnosis not present

## 2017-04-07 DIAGNOSIS — L570 Actinic keratosis: Secondary | ICD-10-CM | POA: Diagnosis not present

## 2017-04-21 ENCOUNTER — Other Ambulatory Visit: Payer: Self-pay

## 2017-04-21 DIAGNOSIS — H02532 Eyelid retraction right lower eyelid: Secondary | ICD-10-CM | POA: Diagnosis not present

## 2017-04-21 DIAGNOSIS — I10 Essential (primary) hypertension: Secondary | ICD-10-CM

## 2017-04-21 DIAGNOSIS — H04123 Dry eye syndrome of bilateral lacrimal glands: Secondary | ICD-10-CM | POA: Diagnosis not present

## 2017-04-21 DIAGNOSIS — H02535 Eyelid retraction left lower eyelid: Secondary | ICD-10-CM | POA: Diagnosis not present

## 2017-04-21 DIAGNOSIS — H16213 Exposure keratoconjunctivitis, bilateral: Secondary | ICD-10-CM | POA: Diagnosis not present

## 2017-04-21 MED ORDER — LOSARTAN POTASSIUM 25 MG PO TABS: 25.0000 mg | ORAL_TABLET | Freq: Every day | ORAL | 0 refills | Status: DC

## 2017-04-27 DIAGNOSIS — M899 Disorder of bone, unspecified: Secondary | ICD-10-CM | POA: Diagnosis not present

## 2017-04-27 DIAGNOSIS — R748 Abnormal levels of other serum enzymes: Secondary | ICD-10-CM | POA: Diagnosis not present

## 2017-04-27 DIAGNOSIS — R7309 Other abnormal glucose: Secondary | ICD-10-CM | POA: Diagnosis not present

## 2017-04-27 DIAGNOSIS — I10 Essential (primary) hypertension: Secondary | ICD-10-CM | POA: Diagnosis not present

## 2017-04-29 ENCOUNTER — Telehealth: Payer: Self-pay | Admitting: Family Medicine

## 2017-04-29 ENCOUNTER — Encounter: Payer: Self-pay | Admitting: Osteopathic Medicine

## 2017-04-29 ENCOUNTER — Ambulatory Visit (INDEPENDENT_AMBULATORY_CARE_PROVIDER_SITE_OTHER): Payer: Medicare Other | Admitting: Osteopathic Medicine

## 2017-04-29 ENCOUNTER — Encounter: Payer: Medicare Other | Admitting: Osteopathic Medicine

## 2017-04-29 VITALS — BP 150/85 | HR 81 | Temp 97.5°F | Wt 128.0 lb

## 2017-04-29 DIAGNOSIS — Z13820 Encounter for screening for osteoporosis: Secondary | ICD-10-CM | POA: Diagnosis not present

## 2017-04-29 DIAGNOSIS — B354 Tinea corporis: Secondary | ICD-10-CM | POA: Diagnosis not present

## 2017-04-29 DIAGNOSIS — Z9189 Other specified personal risk factors, not elsewhere classified: Secondary | ICD-10-CM

## 2017-04-29 DIAGNOSIS — Z1231 Encounter for screening mammogram for malignant neoplasm of breast: Secondary | ICD-10-CM | POA: Diagnosis not present

## 2017-04-29 DIAGNOSIS — Z1239 Encounter for other screening for malignant neoplasm of breast: Secondary | ICD-10-CM

## 2017-04-29 DIAGNOSIS — I10 Essential (primary) hypertension: Secondary | ICD-10-CM

## 2017-04-29 DIAGNOSIS — Z0000 Encounter for general adult medical examination without abnormal findings: Secondary | ICD-10-CM

## 2017-04-29 LAB — COMPLETE METABOLIC PANEL WITH GFR
AG Ratio: 1.7 (calc) (ref 1.0–2.5)
ALKALINE PHOSPHATASE (APISO): 79 U/L (ref 33–130)
ALT: 16 U/L (ref 6–29)
AST: 20 U/L (ref 10–35)
Albumin: 4.3 g/dL (ref 3.6–5.1)
BUN: 12 mg/dL (ref 7–25)
CALCIUM: 9.3 mg/dL (ref 8.6–10.4)
CO2: 29 mmol/L (ref 20–32)
Chloride: 101 mmol/L (ref 98–110)
Creat: 0.63 mg/dL (ref 0.50–0.99)
GFR, EST NON AFRICAN AMERICAN: 92 mL/min/{1.73_m2} (ref >=60)
GFR, Est African American: 106 mL/min/{1.73_m2} (ref >=60)
GLUCOSE: 110 mg/dL — AB (ref 65–99)
Globulin: 2.5 g/dL (calc) (ref 1.9–3.7)
Potassium: 4.6 mmol/L (ref 3.5–5.3)
SODIUM: 136 mmol/L (ref 135–146)
Total Bilirubin: 0.5 mg/dL (ref 0.2–1.2)
Total Protein: 6.8 g/dL (ref 6.1–8.1)

## 2017-04-29 LAB — CBC
HEMATOCRIT: 40.6 % (ref 35.0–45.0)
HEMOGLOBIN: 13.9 g/dL (ref 11.7–15.5)
MCH: 30.3 pg (ref 27.0–33.0)
MCHC: 34.2 g/dL (ref 32.0–36.0)
MCV: 88.6 fL (ref 80.0–100.0)
MPV: 9 fL (ref 7.5–12.5)
Platelets: 368 10*3/uL (ref 140–400)
RBC: 4.58 10*6/uL (ref 3.80–5.10)
RDW: 11.7 % (ref 11.0–15.0)
WBC: 6.7 10*3/uL (ref 3.8–10.8)

## 2017-04-29 LAB — VITAMIN D 25 HYDROXY (VIT D DEFICIENCY, FRACTURES): VIT D 25 HYDROXY: 66 ng/mL (ref 30–100)

## 2017-04-29 LAB — LIPID PANEL
CHOLESTEROL: 199 mg/dL (ref <200)
HDL: 98 mg/dL (ref >50)
LDL CHOLESTEROL (CALC): 87 mg/dL
NON-HDL CHOLESTEROL (CALC): 101 mg/dL (ref <130)
TRIGLYCERIDES: 46 mg/dL (ref <150)
Total CHOL/HDL Ratio: 2 (calc) (ref <5.0)

## 2017-04-29 LAB — VITAMIN B12: Vitamin B-12: 1786 pg/mL — ABNORMAL HIGH (ref 200–1100)

## 2017-04-29 LAB — HEMOGLOBIN A1C W/OUT EAG: Hgb A1c MFr Bld: 5.2 % of total Hgb (ref <5.7)

## 2017-04-29 LAB — T4, FREE: FREE T4: 1.2 ng/dL (ref 0.8–1.8)

## 2017-04-29 LAB — TSH: TSH: 1.96 m[IU]/L (ref 0.40–4.50)

## 2017-04-29 MED ORDER — KETOCONAZOLE 2 % EX CREA: 1.0000 "application " | TOPICAL_CREAM | Freq: Two times a day (BID) | CUTANEOUS | 1 refills | Status: DC

## 2017-04-29 MED ORDER — LOSARTAN POTASSIUM 25 MG PO TABS: 25.0000 mg | ORAL_TABLET | Freq: Every day | ORAL | 3 refills | Status: DC

## 2017-04-29 NOTE — Telephone Encounter (Signed)
-----   Message from Mertha Finders, [place] sent at 04/29/2017  4:19 PM EDT ----- Regarding: FW: colon cancer screening  Pt would like to do the cologuard screening. As per pt, never had abnormal colonoscopy or any family history of colon cancer. Can you please place an order. Thanks in advance.   ----- Message ----- From: Emeterio Reeve, DO Sent: 04/29/2017   2:44 PM To: Mertha Finders, CMA, [facility] Clinical Pool Subject: colon cancer screening                         Please call patient: I forgot to ask her about colon cancer screening at her visit today.  If she wants to a referral for GI for colonoscopy, we can put that in.  If she would rather do cologuard, let's order that as long as we can confirm she has never had abnormal colonoscopy or family history of colon cancer. Thanks!

## 2017-04-29 NOTE — Progress Notes (Signed)
HPI: Bailey Evans is a 67 y.o. female who  has a past medical history of Hypertension.  she presents to [facility] today, 04/29/17,  for chief complaint of: Review labs  Patient is here to follow-up on recent routine blood work.  Not quite due yet for annual physical, will go ahead and review preventive care today as well.  Patient has no other concerns other than small spot itching on her skin which has been there for a few months.   Past medical, surgical, social and family history reviewed:  Patient Active Problem List   Diagnosis Date Noted  . Dry eye 01/14/2017  . Hashimoto's thyroiditis 02/27/2016  . Elevated blood pressure reading 07/16/2015  . History of pneumonia 04/16/2015  . Right leg pain 04/02/2015  . Osteoporosis 04/02/2015  . Postmenopausal 04/02/2015  . Epiphora due to insufficient drainage, bilateral 01/07/2015  . Dry eye syndrome, bilateral 07/12/2014  . Obstructive chronic bronchitis ([place]) 02/03/2008    Past Surgical History:  Procedure Laterality Date  . EYE SURGERY      Social History   Tobacco Use  . Smoking status: Never Smoker  . Smokeless tobacco: Never Used  Substance Use Topics  . Alcohol use: Yes    Alcohol/week: 3.0 oz    Types: 5 Glasses of wine per week    Comment: Typically will have a glass of red wine in the evenings.    Family History  Problem Relation Age of Onset  . Cancer Mother   . Heart attack Father   . Heart disease Paternal Uncle   . Cancer Maternal Grandmother   . Heart disease Paternal Uncle   . Heart disease Paternal Uncle      Current medication list and allergy/intolerance information reviewed:    Current Outpatient Medications  Medication Sig Dispense Refill  . Calcium-Vitamin D 600-200 MG-UNIT tablet Take by mouth.    . Coenzyme Q10 (CO Q 10 PO) Take by mouth.    . Flaxseed, Linseed, (FLAXSEED OIL) 1000 MG CAPS Take by mouth.    . losartan (COZAAR) 25 MG tablet Take 1  tablet (25 mg total) by mouth daily. APPOINTMENT NEEDED FOR FURTHER REFILLS 30 tablet 0  . Multiple Vitamin (MULTI-VITAMINS) TABS Take by mouth.    . Omega-3 Fatty Acids (FISH OIL PO) Take 1 capsule by mouth 2 (two) times daily.     . TURMERIC PO Take by mouth.     No current facility-administered medications for this visit.     Allergies  Allergen Reactions  . Penicillin G Rash      Review of Systems:  Constitutional:  No  fever, no chills, No recent illness,.   HEENT: No  headache, no vision change, no hearing change, No sore throat, No  sinus pressure  Cardiac: No  chest pain, No  pressure, No palpitations,  Respiratory:  No  shortness of breath. No  Cough  Gastrointestinal: No  abdominal pain, No  nausea, No  vomiting,  No  blood in stool, No  diarrhea, No  constipation   Musculoskeletal: No new myalgia/arthralgia  Skin: +Rash  Hem/Onc: No  easy bruising/bleeding   Neurologic: No  weakness, No  dizziness,  Psychiatric: No  concerns with depression, No  concerns with anxiety, No sleep problems, No mood problems  Exam:  BP 138/78 (BP Location: Left Arm, Patient Position: Sitting, Cuff Size: Normal)   Pulse 100   Temp (!) 97.5 F (36.4 C) (Oral)   Wt 128 lb (  58.1 kg)   BMI 23.41 kg/m   Constitutional: VS see above. General Appearance: alert, well-developed, well-nourished, NAD  Eyes: Normal lids and conjunctive, non-icteric sclera  Ears, Nose, Mouth, Throat: MMM, Normal external inspection ears/nares/mouth/lips/gums. TM normal bilaterally. Pharynx/tonsils no erythema, no exudate. Nasal mucosa normal.   Neck: No masses, trachea midline. No thyroid enlargement. No tenderness/mass appreciated. No lymphadenopathy  Respiratory: Normal respiratory effort. no wheeze, no rhonchi, no rales  Cardiovascular: S1/S2 normal, no murmur, no rub/gallop auscultated. RRR. No lower extremity edema.   Gastrointestinal: Nontender, no masses. No hepatomegaly, no splenomegaly. No  hernia appreciated. Bowel sounds normal. Rectal exam deferred.   Musculoskeletal: Gait normal. No clubbing/cyanosis of digits.   Neurological: Normal balance/coordination. No tremor.    Skin: warm, dry, intact.  About 1 cm diameter fairly well-circumscribed area on right lower abdomen consistent with tinea.  No concerning nevi or subq nodules on limited exam.    Psychiatric: Normal judgment/insight. Normal mood and affect. Oriented x3.    Results for orders placed or performed in visit on 12/09/16 (from the past 72 hour(s))  CBC     Status: None   Collection Time: 04/27/17  9:57 AM  Result Value Ref Range   WBC 6.7 3.8 - 10.8 Thousand/uL   RBC 4.58 3.80 - 5.10 Million/uL   Hemoglobin 13.9 11.7 - 15.5 g/dL   HCT 40.6 35.0 - 45.0 %   MCV 88.6 80.0 - 100.0 fL   MCH 30.3 27.0 - 33.0 pg   MCHC 34.2 32.0 - 36.0 g/dL   RDW 11.7 11.0 - 15.0 %   Platelets 368 140 - 400 Thousand/uL   MPV 9.0 7.5 - 12.5 fL  COMPLETE METABOLIC PANEL WITH GFR     Status: Abnormal   Collection Time: 04/27/17  9:57 AM  Result Value Ref Range   Glucose, Bld 110 (H) 65 - 99 mg/dL    Comment: .            Fasting reference interval . For someone without known diabetes, a glucose value between 100 and 125 mg/dL is consistent with prediabetes and should be confirmed with a follow-up test. .    BUN 12 7 - 25 mg/dL   Creat 0.63 0.50 - 0.99 mg/dL    Comment: For patients >58 years of age, the reference limit for Creatinine is approximately 13% higher for people identified as African-American. .    GFR, Est Non African American 92 > OR = 60 mL/min/1.33m2   GFR, Est African American 106 > OR = 60 mL/min/1.25m2   BUN/Creatinine Ratio NOT APPLICABLE 6 - 22 (calc)   Sodium 136 135 - 146 mmol/L   Potassium 4.6 3.5 - 5.3 mmol/L   Chloride 101 98 - 110 mmol/L   CO2 29 20 - 32 mmol/L   Calcium 9.3 8.6 - 10.4 mg/dL   Total Protein 6.8 6.1 - 8.1 g/dL   Albumin 4.3 3.6 - 5.1 g/dL   Globulin 2.5 1.9 - 3.7 g/dL  (calc)   AG Ratio 1.7 1.0 - 2.5 (calc)   Total Bilirubin 0.5 0.2 - 1.2 mg/dL   Alkaline phosphatase (APISO) 79 33 - 130 U/L   AST 20 10 - 35 U/L   ALT 16 6 - 29 U/L  Lipid panel     Status: None   Collection Time: 04/27/17  9:57 AM  Result Value Ref Range   Cholesterol 199 <200 mg/dL   HDL 98 >50 mg/dL   Triglycerides 46 <150 mg/dL   LDL  Cholesterol (Calc) 87 mg/dL (calc)    Comment: Reference range: <100 . Desirable range <100 mg/dL for primary prevention;   <70 mg/dL for patients with CHD or diabetic patients  with > or = 2 CHD risk factors. Marland Kitchen LDL-C is now calculated using the Martin-Hopkins  calculation, which is a validated novel method providing  better accuracy than the Friedewald equation in the  estimation of LDL-C.  Cresenciano Genre et al. Annamaria Helling. 3785;885(02): 2061-2068  (http://education.[facility].com/faq/FAQ164)    Total CHOL/HDL Ratio 2.0 <5.0 (calc)   Non-HDL Cholesterol (Calc) 101 <130 mg/dL (calc)    Comment: For patients with diabetes plus 1 major ASCVD risk  factor, treating to a non-HDL-C goal of <100 mg/dL  (LDL-C of <70 mg/dL) is considered a therapeutic  option.   VITAMIN D 25 Hydroxy (Vit-D Deficiency, Fractures)     Status: None   Collection Time: 04/27/17  9:57 AM  Result Value Ref Range   Vit D, 25-Hydroxy 66 30 - 100 ng/mL    Comment: Vitamin D Status         25-OH Vitamin D: . Deficiency:                    <20 ng/mL Insufficiency:             20 - 29 ng/mL Optimal:                 > or = 30 ng/mL . For 25-OH Vitamin D testing on patients on  D2-supplementation and patients for whom quantitation  of D2 and D3 fractions is required, the [facility]ure[facility](TM) 25-OH VIT D, (D2,D3), LC/MS/MS is recommended: order  code [phone] (patients >78yrs). . For more information on this test, go to: http://education.[facility].com/faq/FAQ163 (This link is being provided for  informational/educational purposes only.)   Vitamin B12     Status: Abnormal    Collection Time: 04/27/17  9:57 AM  Result Value Ref Range   Vitamin B-12 1,786 (H) 200 - 1,100 pg/mL  TSH     Status: None   Collection Time: 04/27/17  9:57 AM  Result Value Ref Range   TSH 1.96 0.40 - 4.50 mIU/L  T4, free     Status: None   Collection Time: 04/27/17  9:57 AM  Result Value Ref Range   Free T4 1.2 0.8 - 1.8 ng/dL  Hemoglobin A1C w/out eAG     Status: None   Collection Time: 04/27/17  9:57 AM  Result Value Ref Range   Hgb A1c MFr Bld 5.2 <5.7 % of total Hgb    Comment: For the purpose of screening for the presence of diabetes: . <5.7%       Consistent with the absence of diabetes 5.7-6.4%    Consistent with increased risk for diabetes             (prediabetes) > or =6.5%  Consistent with diabetes . This assay result is consistent with a decreased risk of diabetes. . Currently, no consensus exists regarding use of hemoglobin A1c for diagnosis of diabetes in children. . According to American Diabetes Association (ADA) guidelines, hemoglobin A1c <7.0% represents optimal control in non-pregnant diabetic patients. Different metrics may apply to specific patient populations.  Standards of Medical Care in Diabetes(ADA). .     No results found.   ASSESSMENT/PLAN: Doing well! Will keep an eye on BP, recheck was higher. Pt will bring home BP cuff in - nurse visit to verify BP monitor   Encounter for preventive care  Essential hypertension - Plan: losartan (COZAAR) 25 MG tablet  At risk for decreased bone density - Plan: DG Bone Density  Breast cancer screening - Plan: MM DIGITAL SCREENING BILATERAL  Screening for osteoporosis - Plan: DG Bone Density  Tinea corporis - Plan: ketoconazole (NIZORAL) 2 % cream   FEMALE PREVENTIVE CARE Updated 04/29/17   ANNUAL SCREENING/COUNSELING  Diet/Exercise - HEALTHY HABITS DISCUSSED TO DECREASE CV RISK Social History   Tobacco Use  Smoking Status Never Smoker  Smokeless Tobacco Never Used    Social History    Substance and Sexual Activity  Alcohol Use Yes  . Alcohol/week: 3.0 oz  . Types: 5 Glasses of wine per week   Comment: Typically will have a glass of red wine in the evenings.    Depression screen PHQ 2/9 12/09/2016  Decreased Interest 0  Down, Depressed, Hopeless 0  PHQ - 2 Score 0     Domestic violence concerns - no  HTN SCREENING - SEE [place]  Sexually active in the past year - Yes with female.  Need/want STI testing today? - no  INFECTIOUS DISEASE SCREENING  HIV - does not need  GC/CT - does not need  HepC - DOB [birth year] - does not need  TB - does not need  DISEASE SCREENING  Lipid - does not need  DM2 - does not need  Osteoporosis - women age 103+ - needs  CANCER SCREENING  Cervical - does not need  Breast - needs  Lung - does not need  Colon - needs  ADULT VACCINATION  Influenza - annual vaccine recommended  Td - booster every 10 years   Zoster - Shingrix recommended 50+  PCV13 - already has  PPSV23 - already has Immunization History  Administered Date(s) Administered  . Influenza, High Dose Seasonal PF 12/09/2016  . Influenza,inj,Quad PF,6+ Mos 11/07/2015, 12/09/2016  . Influenza-Unspecified 11/17/2014  . Pneumococcal Conjugate-13 11/01/2014  . Pneumococcal Polysaccharide-23 02/27/2016   OTHER  Fall - exercise and Vit D age 23+ - needs   Visit summary with medication list and pertinent instructions was printed for patient to review. All questions at time of visit were answered - patient instructed to contact office with any additional concerns. ER/RTC precautions were reviewed with the patient.   Follow-up plan: Return in about 6 months (around 10/30/2017) for recheck BP . Sooner as directed for home BP cuff verification   Please note: voice recognition software was used to produce this document, and typos may escape review. Please contact Dr. Sheppard Coil for any needed clarifications.

## 2017-04-29 NOTE — Telephone Encounter (Signed)
Just have form completed and faxed.  Patient does not have to come in and sign the form.

## 2017-04-30 NOTE — Telephone Encounter (Signed)
Cologaurd form faxed today for pt. Confirmation rec'd.

## 2017-05-18 ENCOUNTER — Other Ambulatory Visit: Payer: Self-pay | Admitting: Osteopathic Medicine

## 2017-05-18 DIAGNOSIS — I10 Essential (primary) hypertension: Secondary | ICD-10-CM

## 2017-05-18 DIAGNOSIS — D2272 Melanocytic nevi of left lower limb, including hip: Secondary | ICD-10-CM | POA: Diagnosis not present

## 2017-05-18 DIAGNOSIS — Z08 Encounter for follow-up examination after completed treatment for malignant neoplasm: Secondary | ICD-10-CM | POA: Diagnosis not present

## 2017-05-18 DIAGNOSIS — L821 Other seborrheic keratosis: Secondary | ICD-10-CM | POA: Diagnosis not present

## 2017-05-18 DIAGNOSIS — L570 Actinic keratosis: Secondary | ICD-10-CM | POA: Diagnosis not present

## 2017-05-18 DIAGNOSIS — Z85828 Personal history of other malignant neoplasm of skin: Secondary | ICD-10-CM | POA: Diagnosis not present

## 2017-05-18 DIAGNOSIS — D2262 Melanocytic nevi of left upper limb, including shoulder: Secondary | ICD-10-CM | POA: Diagnosis not present

## 2017-05-18 DIAGNOSIS — L814 Other melanin hyperpigmentation: Secondary | ICD-10-CM | POA: Diagnosis not present

## 2017-05-18 DIAGNOSIS — D2261 Melanocytic nevi of right upper limb, including shoulder: Secondary | ICD-10-CM | POA: Diagnosis not present

## 2017-05-18 DIAGNOSIS — D225 Melanocytic nevi of trunk: Secondary | ICD-10-CM | POA: Diagnosis not present

## 2017-05-18 DIAGNOSIS — M71342 Other bursal cyst, left hand: Secondary | ICD-10-CM | POA: Diagnosis not present

## 2017-05-18 DIAGNOSIS — D2271 Melanocytic nevi of right lower limb, including hip: Secondary | ICD-10-CM | POA: Diagnosis not present

## 2017-06-02 DIAGNOSIS — H02535 Eyelid retraction left lower eyelid: Secondary | ICD-10-CM | POA: Diagnosis not present

## 2017-06-02 DIAGNOSIS — H02532 Eyelid retraction right lower eyelid: Secondary | ICD-10-CM | POA: Diagnosis not present

## 2017-06-02 DIAGNOSIS — H16213 Exposure keratoconjunctivitis, bilateral: Secondary | ICD-10-CM | POA: Diagnosis not present

## 2017-06-22 DIAGNOSIS — R509 Fever, unspecified: Secondary | ICD-10-CM | POA: Diagnosis not present

## 2017-06-22 DIAGNOSIS — R0602 Shortness of breath: Secondary | ICD-10-CM | POA: Diagnosis not present

## 2017-06-22 DIAGNOSIS — J209 Acute bronchitis, unspecified: Secondary | ICD-10-CM | POA: Diagnosis not present

## 2017-06-22 DIAGNOSIS — R05 Cough: Secondary | ICD-10-CM | POA: Diagnosis not present

## 2017-07-25 DIAGNOSIS — H60332 Swimmer's ear, left ear: Secondary | ICD-10-CM | POA: Diagnosis not present

## 2017-07-25 DIAGNOSIS — H6121 Impacted cerumen, right ear: Secondary | ICD-10-CM | POA: Diagnosis not present

## 2017-08-02 DIAGNOSIS — Z1212 Encounter for screening for malignant neoplasm of rectum: Secondary | ICD-10-CM | POA: Diagnosis not present

## 2017-08-02 DIAGNOSIS — Z1211 Encounter for screening for malignant neoplasm of colon: Secondary | ICD-10-CM | POA: Diagnosis not present

## 2017-08-03 ENCOUNTER — Telehealth: Payer: Self-pay

## 2017-08-03 DIAGNOSIS — M899 Disorder of bone, unspecified: Principal | ICD-10-CM

## 2017-08-03 DIAGNOSIS — M259 Joint disorder, unspecified: Secondary | ICD-10-CM

## 2017-08-03 NOTE — Telephone Encounter (Signed)
Pt called saying her insurance is not going to cover vit D testing due to coding. As per pt, vit d will be covered if test is coded as osteoporosis. Pt does not have osteoporosis history listed in medical history. Pls advise. Thanks.

## 2017-08-06 DIAGNOSIS — M899 Disorder of bone, unspecified: Principal | ICD-10-CM

## 2017-08-06 DIAGNOSIS — M259 Joint disorder, unspecified: Secondary | ICD-10-CM | POA: Insufficient documentation

## 2017-08-06 NOTE — Telephone Encounter (Signed)
Can we call insurance and see if it be coded under M89.9?

## 2017-08-06 NOTE — Telephone Encounter (Signed)
Contacted [facility] - spoke to Rep [place]. Add'l dx code M89.9 submitted. Turn around time for decision will be 30 - 45 days. Pt is aware that a letter will be mailed to her home whether insurance will be pay for vit d testing with added dx code.

## 2017-08-12 ENCOUNTER — Telehealth: Payer: Self-pay | Admitting: Osteopathic Medicine

## 2017-08-12 LAB — COLOGUARD: Cologuard: NEGATIVE

## 2017-08-12 NOTE — Telephone Encounter (Signed)
Please call patient: Cologuard testing was negative, will plan to repeat test in another 3 years

## 2017-08-13 NOTE — Telephone Encounter (Signed)
Patient advised of recommendations.  

## 2017-08-13 NOTE — Telephone Encounter (Signed)
Left VM for Pt to return clinic call regarding results, callback information provided. 

## 2017-09-13 ENCOUNTER — Encounter: Payer: Self-pay | Admitting: Osteopathic Medicine

## 2017-10-27 DIAGNOSIS — D485 Neoplasm of uncertain behavior of skin: Secondary | ICD-10-CM | POA: Diagnosis not present

## 2017-10-27 DIAGNOSIS — D0461 Carcinoma in situ of skin of right upper limb, including shoulder: Secondary | ICD-10-CM | POA: Diagnosis not present

## 2017-11-03 ENCOUNTER — Ambulatory Visit (INDEPENDENT_AMBULATORY_CARE_PROVIDER_SITE_OTHER): Payer: Medicare Other | Admitting: Physician Assistant

## 2017-11-03 ENCOUNTER — Other Ambulatory Visit: Payer: Self-pay

## 2017-11-03 DIAGNOSIS — Z23 Encounter for immunization: Secondary | ICD-10-CM

## 2017-11-05 ENCOUNTER — Encounter: Payer: Self-pay | Admitting: Osteopathic Medicine

## 2017-11-24 DIAGNOSIS — D0461 Carcinoma in situ of skin of right upper limb, including shoulder: Secondary | ICD-10-CM | POA: Diagnosis not present

## 2017-11-24 DIAGNOSIS — Z189 Retained foreign body fragments, unspecified material: Secondary | ICD-10-CM | POA: Diagnosis not present

## 2017-11-24 DIAGNOSIS — L905 Scar conditions and fibrosis of skin: Secondary | ICD-10-CM | POA: Diagnosis not present

## 2017-12-07 DIAGNOSIS — H25013 Cortical age-related cataract, bilateral: Secondary | ICD-10-CM | POA: Diagnosis not present

## 2017-12-07 DIAGNOSIS — H04123 Dry eye syndrome of bilateral lacrimal glands: Secondary | ICD-10-CM | POA: Diagnosis not present

## 2017-12-07 DIAGNOSIS — H2513 Age-related nuclear cataract, bilateral: Secondary | ICD-10-CM | POA: Diagnosis not present

## 2017-12-16 DIAGNOSIS — L570 Actinic keratosis: Secondary | ICD-10-CM | POA: Diagnosis not present

## 2017-12-16 DIAGNOSIS — S80811A Abrasion, right lower leg, initial encounter: Secondary | ICD-10-CM | POA: Diagnosis not present

## 2018-05-09 NOTE — Progress Notes (Signed)
Subjective:   Bailey Evans is a 68 y.o. female who presents for an Initial Medicare Annual Wellness Visit.  Review of Systems    No ROS.  Medicare Wellness Visit. Additional risk factors are reflected in the social history.    Cardiac Risk Factors include: none Sleep patterns: Getting 8-9 hours of sleep a night. Wakes up 1 time during the night tyo go to the bathroom. Wakes up feeling rested.  Home Safety/Smoke Alarms: Feels safe in home. Smoke alarms in place.  Living environment;Lives with husband in 2 story home. No hand rails in place. Shower is a step over tub/sower combo and no grab bars in place. Seat Belt Safety/Bike Helmet: Wears seat belt.   Female:   Pap- aged out      [place]- offered pt declined     Dexa scan- ordered       CCS- aged out     Objective:    Today's Vitals   05/10/18 1421  BP: 123/72  Pulse: 78  Weight: 128 lb (58.1 kg)  Height: 5\' 2"  (1.575 m)   Body mass index is 23.41 kg/m.  Advanced Directives 05/10/2018 02/27/2016  Does Patient Have a Medical Advance Directive? Yes Yes  Type of Paramedic of [place];Living will [place];Living will  Does patient want to make changes to medical advance directive? No - Patient declined No - Patient declined  Copy of [place] in Chart? No - copy requested No - copy requested    Current Medications (verified) Outpatient Encounter Medications as of 05/10/2018  Medication Sig  . Ascorbic Acid (VITAMIN C) 1000 MG tablet Take 1,000 mg by mouth 2 (two) times daily.  . Coenzyme Q10 (CO Q 10 PO) Take by mouth.  . losartan (COZAAR) 25 MG tablet Take 1 tablet (25 mg total) by mouth daily.  . Multiple Vitamin (MULTI-VITAMINS) TABS Take by mouth.  . Omega-3 Fatty Acids (FISH OIL PO) Take 1 capsule by mouth 2 (two) times daily.   . TURMERIC PO Take by mouth.  . vitamin A 10000 UNIT capsule Take 10,000 Units by mouth daily.   No facility-administered  encounter medications on file as of 05/10/2018.     Allergies (verified) Penicillin g   History: Past Medical History:  Diagnosis Date  . Hypertension    Past Surgical History:  Procedure Laterality Date  . EYE SURGERY     Family History  Problem Relation Age of Onset  . Cancer Mother   . Heart attack Father   . Heart disease Paternal Uncle   . Cancer Maternal Grandmother   . Heart disease Paternal Uncle   . Heart disease Paternal Uncle    Social History   Socioeconomic History  . Marital status: Married    Spouse name: Bailey Evans  . Number of children: 2  . Years of education: 12  . Highest education level: 12th grade  Occupational History  . 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 Financial resource strain: Not hard at all  . Food insecurity:    Worry: Never true    Inability: Never true  . Transportation needs:    Medical: No    Non-medical: No  Tobacco Use  . Smoking status: Never Smoker  . Smokeless tobacco: Never Used  Substance and Sexual Activity  . Alcohol use: Yes    Alcohol/week: 5.0 standard drinks    Types: 5 Glasses of wine per week  Comment: Typically will have a glass of red wine in the evenings.  . Drug use: No  . Sexual activity: Not Currently  Lifestyle  . Physical activity:    Days per week: 5 days    Minutes per session: 30 min  . Stress: Not at all  Relationships  . Social connections:    Talks on phone: More than three times a week    Gets together: Twice a week    Attends religious service: More than 4 times per year    Active member of club or organization: No    Attends meetings of clubs or organizations: Never    Relationship status: Married  Other Topics Concern  . Not on file  Social History Narrative   Makes breakfast. 2 cups of coffee. Walks on treadmill for 30 minutes at a time.   Housework.    Tobacco Counseling Counseling given: Not Answered   Clinical Intake:  Pre-visit  preparation completed: Yes  Pain : No/denies pain     Nutritional Risks: None Diabetes: No  How often do you need to have someone help you when you read instructions, pamphlets, or other written materials from your doctor or pharmacy?: 1 - Never What is the last grade level you completed in school?: 12  Interpreter Needed?: No  Information entered by :: Orlie Dakin, LPN   Activities of Daily Living In your present state of health, do you have any difficulty performing the following activities: 05/10/2018  Hearing? N  Vision? N  Difficulty concentrating or making decisions? N  Walking or climbing stairs? N  Dressing or bathing? N  Doing errands, shopping? N  Preparing Food and eating ? N  Using the Toilet? N  In the past six months, have you accidently leaked urine? N  Do you have problems with loss of bowel control? N  Managing your Medications? N  Managing your Finances? N  Housekeeping or managing your Housekeeping? N  Some recent data might be hidden     Immunizations and Health Maintenance Immunization History  Administered Date(s) Administered  . Influenza, High Dose Seasonal PF 12/09/2016  . Influenza,inj,Quad PF,6+ Mos 11/07/2015, 12/09/2016, 11/03/2017  . Influenza-Unspecified 11/17/2014  . Pneumococcal Conjugate-13 11/01/2014  . Pneumococcal Polysaccharide-23 02/27/2016   Health Maintenance Due  Topic Date Due  . TETANUS/TDAP  02/13/1967  . MAMMOGRAM  02/12/1998  . DEXA SCAN  02/12/2013    Patient Care Team: Emeterio Reeve, DO as PCP - General (Osteopathic Medicine)  Indicate any recent Medical Services you may have received from other than [facility] providers in the past year (date may be approximate).     Assessment:   This is a routine wellness examination for Bailey Evans.Physical assessment deferred to PCP.   Hearing/Vision screen Hearing Screening Comments: Vision not completed due to visit over the phone due to the COVID19 crisis Vision Screening  Comments: Hearing not done due to over the phone visit, due to the [place] crisis. Diet Eats a very healthy diet full of vegetables, fruits and proteins. Gets at least 2 servings of dairy a day. Breakfast: yogurt with fruit Lunch: fruit more like snake Dinner:  Meat, vegetable, starch.    Dietary issues and exercise activities discussed: Current Exercise Habits: Home exercise routine, Type of exercise: treadmill, Time (Minutes): 30, Frequency (Times/Week): 5, Weekly Exercise (Minutes/Week): 150, Intensity: Mild, Exercise limited by: None identified  Goals    . Eat more fruits and vegetables (pt-stated)     Pt. Would like to eat more  fruits and vegs q day for the next year.      Depression Screen PHQ 2/9 Scores 05/10/2018 12/09/2016 02/27/2016  PHQ - 2 Score 0 0 0    Fall Risk Fall Risk  05/10/2018 02/27/2016  Falls in the past year? 0 Yes  Injury with Fall? - Yes  Comment - Laceration to right lower leg r/t flip flop getting caught on steps and sustaining a laceration.  It healed w/o complications.  Follow up Falls prevention discussed -    Is the patient's home free of loose throw rugs in walkways, pet beds, electrical cords, etc?   yes      Grab bars in the bathroom? no      Handrails on the stairs?   no      Adequate lighting?   yes  Cognitive Function:     6CIT Screen 05/10/2018 02/27/2016  What Year? 0 points 0 points  What month? 0 points 0 points  What time? 0 points 0 points  Count back from 20 0 points 0 points  Months in reverse 0 points 0 points  Repeat phrase 0 points 0 points  Total Score 0 0    Screening Tests Health Maintenance  Topic Date Due  . TETANUS/TDAP  02/13/1967  . MAMMOGRAM  02/12/1998  . DEXA SCAN  02/12/2013  . INFLUENZA VACCINE  09/03/2018  . Fecal DNA (Cologuard)  08/12/2020  . Hepatitis C Screening  Completed  . PNA vac Low Risk Adult  Completed        Plan:      Ms. Epstein , Thank you for taking time to come for your Medicare  Wellness Visit. I appreciate your ongoing commitment to your health goals. Please review the following plan we discussed and let me know if I can assist you in the future.   Please schedule your next medicare wellness visit with me in 1 yr. Continue doing brain stimulating activities (puzzles, reading, adult coloring books, staying active) to keep memory sharp.    These are the goals we discussed: Goals    . Eat more fruits and vegetables (pt-stated)     Pt. Would like to eat more fruits and vegs q day for the next year.       This is a list of the screening recommended for you and due dates:  Health Maintenance  Topic Date Due  . Tetanus Vaccine  02/13/1967  . Mammogram  02/12/1998  . DEXA scan (bone density measurement)  02/12/2013  . Flu Shot  09/03/2018  . Cologuard (Stool DNA test)  08/12/2020  .  Hepatitis C: One time screening is recommended by Center for Disease Control  (CDC) for  adults born from [birth year] through 1967.   Completed  . Pneumonia vaccines  Completed          These are the goals we discussed: Goals    . Eat more fruits and vegetables (pt-stated)     Pt. Would like to eat more fruits and vegs q day for the next year.       This is a list of the screening recommended for you and due dates:  Health Maintenance  Topic Date Due  . Tetanus Vaccine  02/13/1967  . Mammogram  02/12/1998  . DEXA scan (bone density measurement)  02/12/2013  . Flu Shot  09/03/2018  . Cologuard (Stool DNA test)  08/12/2020  .  Hepatitis C: One time screening is recommended by Center for Disease Control  (CDC) for  adults born from [birth year] through 1967.   Completed  . Pneumonia vaccines  Completed     I have personally reviewed and noted the following in the patient's chart:   . Medical and social history . Use of alcohol, tobacco or illicit drugs  . Current medications and supplements . Functional ability and status . Nutritional status . Physical activity . Advanced  directives . List of other physicians . Hospitalizations, surgeries, and ER visits in previous 12 months . Vitals . Screenings to include cognitive, depression, and falls . Referrals and appointments  In addition, I have reviewed and discussed with patient certain preventive protocols, quality metrics, and best practice recommendations. A written personalized care plan for preventive services as well as general preventive health recommendations were provided to patient.     Joanne Chars, LPN   04/06/6699

## 2018-05-10 ENCOUNTER — Other Ambulatory Visit: Payer: Self-pay

## 2018-05-10 ENCOUNTER — Ambulatory Visit (INDEPENDENT_AMBULATORY_CARE_PROVIDER_SITE_OTHER): Payer: Medicare Other | Admitting: *Deleted

## 2018-05-10 VITALS — BP 123/72 | HR 78 | Ht 62.0 in | Wt 128.0 lb

## 2018-05-10 DIAGNOSIS — Z780 Asymptomatic menopausal state: Secondary | ICD-10-CM | POA: Diagnosis not present

## 2018-05-10 DIAGNOSIS — Z13820 Encounter for screening for osteoporosis: Secondary | ICD-10-CM

## 2018-05-10 DIAGNOSIS — Z0000 Encounter for general adult medical examination without abnormal findings: Secondary | ICD-10-CM

## 2018-05-10 NOTE — Patient Instructions (Signed)
Bailey Evans , Thank you for taking time to come for your Medicare Wellness Visit. I appreciate your ongoing commitment to your health goals. Please review the following plan we discussed and let me know if I can assist you in the future.   Please schedule your next medicare wellness visit with me in 1 yr. Continue doing brain stimulating activities (puzzles, reading, adult coloring books, staying active) to keep memory sharp.   These are the goals we discussed: Goals    . Eat more fruits and vegetables (pt-stated)     Pt. Would like to eat more fruits and vegs q day for the next year.

## 2018-05-27 ENCOUNTER — Encounter (INDEPENDENT_AMBULATORY_CARE_PROVIDER_SITE_OTHER): Payer: Medicare Other | Admitting: Osteopathic Medicine

## 2018-05-27 DIAGNOSIS — M25532 Pain in left wrist: Secondary | ICD-10-CM

## 2018-05-30 NOTE — Telephone Encounter (Signed)
Spent 5 mins

## 2018-05-31 ENCOUNTER — Other Ambulatory Visit: Payer: Self-pay

## 2018-05-31 DIAGNOSIS — I10 Essential (primary) hypertension: Secondary | ICD-10-CM

## 2018-05-31 MED ORDER — LOSARTAN POTASSIUM 25 MG PO TABS: 25.0000 mg | ORAL_TABLET | Freq: Every day | ORAL | 3 refills | Status: DC

## 2018-06-29 ENCOUNTER — Telehealth: Payer: Self-pay | Admitting: Osteopathic Medicine

## 2018-06-29 DIAGNOSIS — E038 Other specified hypothyroidism: Secondary | ICD-10-CM

## 2018-06-29 NOTE — Telephone Encounter (Signed)
Order is in.

## 2018-06-29 NOTE — Telephone Encounter (Signed)
Pt advised.

## 2018-06-29 NOTE — Telephone Encounter (Signed)
Patient calling in wanting lab work to check her thyroid. Please contact and advise.

## 2018-07-15 DIAGNOSIS — E038 Other specified hypothyroidism: Secondary | ICD-10-CM | POA: Diagnosis not present

## 2018-07-15 DIAGNOSIS — E063 Autoimmune thyroiditis: Secondary | ICD-10-CM | POA: Diagnosis not present

## 2018-07-15 LAB — TSH+FREE T4: TSH W/REFLEX TO FT4: 1.33 mIU/L (ref 0.40–4.50)

## 2018-09-02 IMAGING — DX DG PELVIS 1-2V
1 series · 1 of 1 positions shown · non-contrast
Comparison: No recent prior.

CLINICAL DATA: Joint pain.

EXAM:
PELVIS - 1-2 VIEW

[pelvis ap]
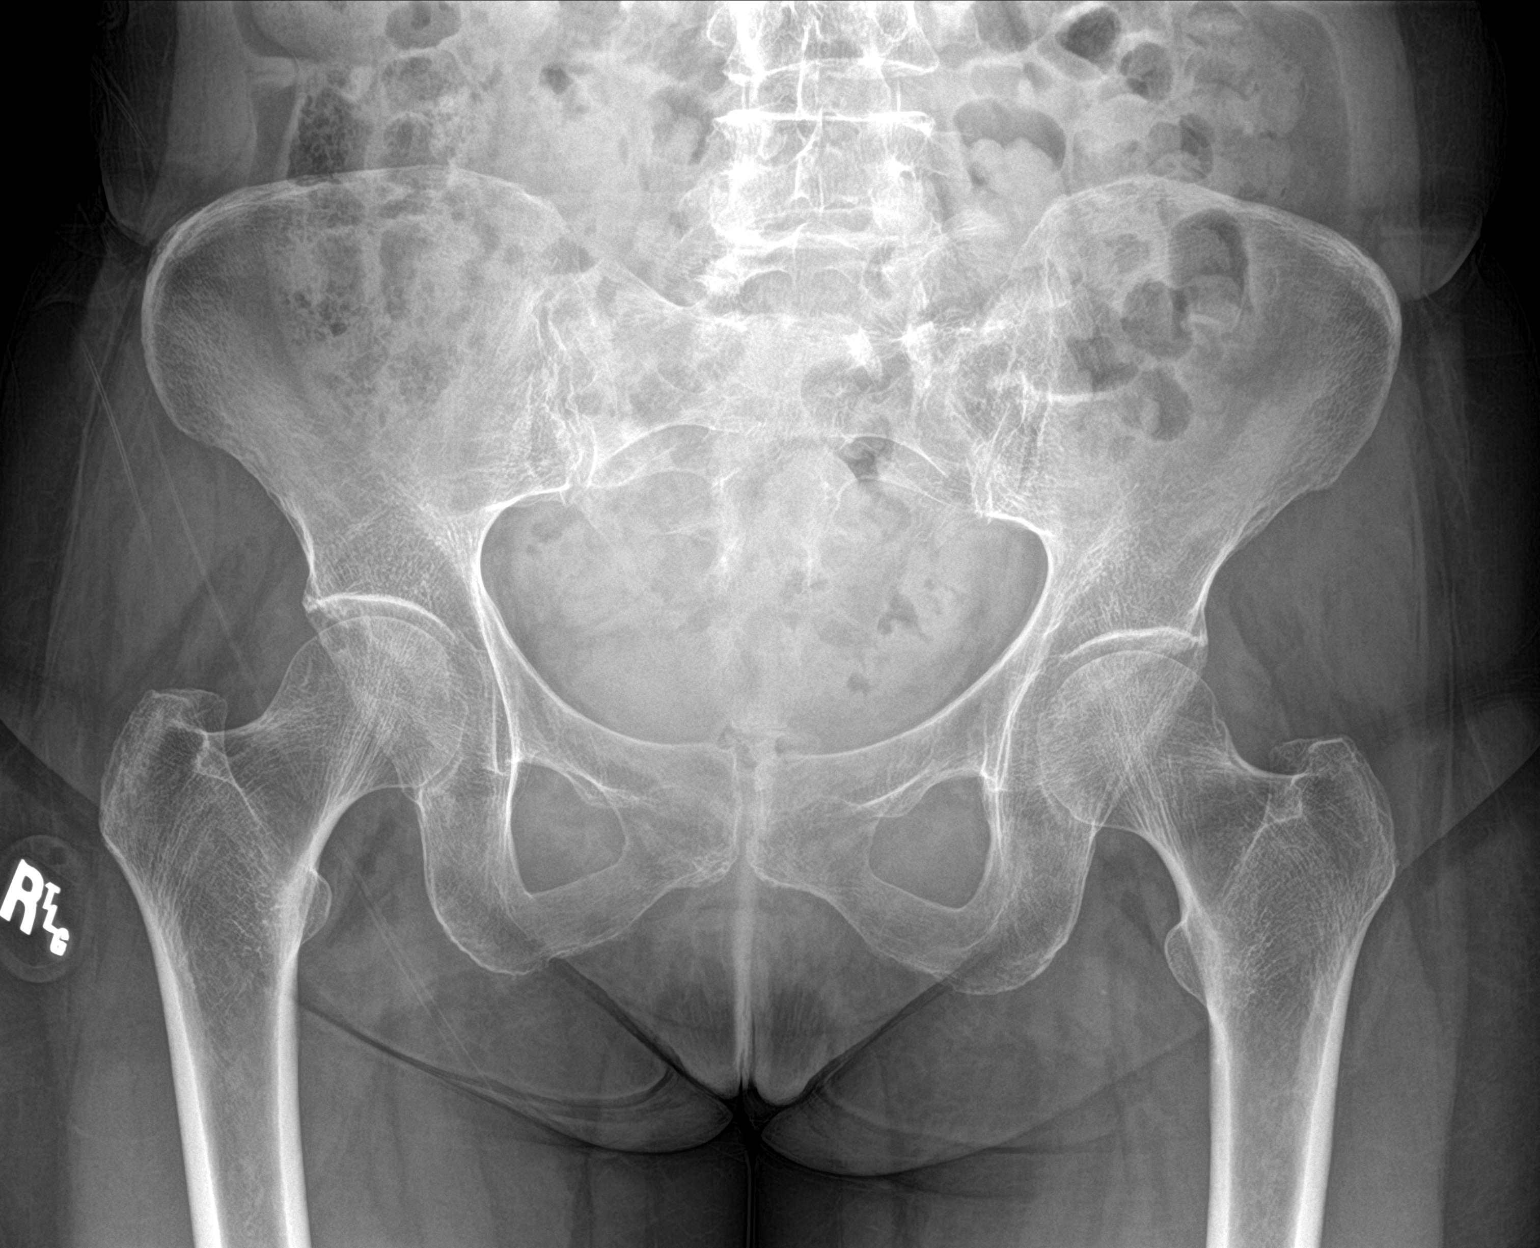

[1 of 1 positions shown; findings below may reference images not displayed]

FINDINGS: Degenerative changes lumbar spine and both hips. Diffuse osteopenia.
No evidence of fracture.
IMPRESSION: Diffuse osteopenia and degenerative change.  No acute abnormality.

## 2018-09-26 ENCOUNTER — Telehealth: Payer: Self-pay | Admitting: Osteopathic Medicine

## 2018-09-26 NOTE — Telephone Encounter (Signed)
error 

## 2018-09-27 ENCOUNTER — Ambulatory Visit: Payer: Medicare Other | Admitting: Osteopathic Medicine

## 2018-10-19 DIAGNOSIS — Z23 Encounter for immunization: Secondary | ICD-10-CM | POA: Diagnosis not present

## 2018-12-20 DIAGNOSIS — H02535 Eyelid retraction left lower eyelid: Secondary | ICD-10-CM | POA: Diagnosis not present

## 2018-12-20 DIAGNOSIS — H25013 Cortical age-related cataract, bilateral: Secondary | ICD-10-CM | POA: Diagnosis not present

## 2018-12-20 DIAGNOSIS — H04123 Dry eye syndrome of bilateral lacrimal glands: Secondary | ICD-10-CM | POA: Diagnosis not present

## 2018-12-20 DIAGNOSIS — H02532 Eyelid retraction right lower eyelid: Secondary | ICD-10-CM | POA: Diagnosis not present

## 2018-12-20 DIAGNOSIS — H2513 Age-related nuclear cataract, bilateral: Secondary | ICD-10-CM | POA: Diagnosis not present

## 2019-03-13 DIAGNOSIS — H02055 Trichiasis without entropian left lower eyelid: Secondary | ICD-10-CM | POA: Diagnosis not present

## 2019-03-13 DIAGNOSIS — H02532 Eyelid retraction right lower eyelid: Secondary | ICD-10-CM | POA: Diagnosis not present

## 2019-03-13 DIAGNOSIS — H01139 Eczematous dermatitis of unspecified eye, unspecified eyelid: Secondary | ICD-10-CM | POA: Diagnosis not present

## 2019-03-13 DIAGNOSIS — H02052 Trichiasis without entropian right lower eyelid: Secondary | ICD-10-CM | POA: Diagnosis not present

## 2019-03-13 DIAGNOSIS — H02535 Eyelid retraction left lower eyelid: Secondary | ICD-10-CM | POA: Diagnosis not present

## 2019-03-29 DIAGNOSIS — D225 Melanocytic nevi of trunk: Secondary | ICD-10-CM | POA: Diagnosis not present

## 2019-03-29 DIAGNOSIS — L814 Other melanin hyperpigmentation: Secondary | ICD-10-CM | POA: Diagnosis not present

## 2019-03-29 DIAGNOSIS — Z85828 Personal history of other malignant neoplasm of skin: Secondary | ICD-10-CM | POA: Diagnosis not present

## 2019-03-29 DIAGNOSIS — L578 Other skin changes due to chronic exposure to nonionizing radiation: Secondary | ICD-10-CM | POA: Diagnosis not present

## 2019-03-29 DIAGNOSIS — D485 Neoplasm of uncertain behavior of skin: Secondary | ICD-10-CM | POA: Diagnosis not present

## 2019-03-29 DIAGNOSIS — L821 Other seborrheic keratosis: Secondary | ICD-10-CM | POA: Diagnosis not present

## 2019-03-29 DIAGNOSIS — C4402 Squamous cell carcinoma of skin of lip: Secondary | ICD-10-CM | POA: Diagnosis not present

## 2019-03-29 DIAGNOSIS — L905 Scar conditions and fibrosis of skin: Secondary | ICD-10-CM | POA: Diagnosis not present

## 2019-03-30 DIAGNOSIS — Z23 Encounter for immunization: Secondary | ICD-10-CM | POA: Diagnosis not present

## 2019-04-07 ENCOUNTER — Telehealth: Payer: Self-pay

## 2019-04-07 ENCOUNTER — Other Ambulatory Visit: Payer: Self-pay

## 2019-04-07 DIAGNOSIS — I10 Essential (primary) hypertension: Secondary | ICD-10-CM

## 2019-04-07 DIAGNOSIS — Z0189 Encounter for other specified special examinations: Secondary | ICD-10-CM

## 2019-04-07 MED ORDER — LOSARTAN POTASSIUM 25 MG PO TABS: 25.0000 mg | ORAL_TABLET | Freq: Every day | ORAL | 1 refills | Status: AC

## 2019-04-07 NOTE — Telephone Encounter (Signed)
Pt has been updated and aware of lab order and provider's note. Currently out of town. She will contact the office to schedule an appt one week after completing lab order. No other inquiries during call.

## 2019-04-07 NOTE — Telephone Encounter (Signed)
Pt called requesting lab order for thyroid, Vit D, and Vit A check. Pls review pending lab order. Thanks.

## 2019-04-07 NOTE — Telephone Encounter (Signed)
OK I signed of fon requested orders Vitmain d and Vitamin A will probably not be covered by insurance, Juluis Rainier  Overdue for annual physical w/ me I suggest she come see em and we can discuss labs

## 2019-04-21 DIAGNOSIS — Z23 Encounter for immunization: Secondary | ICD-10-CM | POA: Diagnosis not present

## 2019-05-03 DIAGNOSIS — C441292 Squamous cell carcinoma of skin of left lower eyelid, including canthus: Secondary | ICD-10-CM | POA: Diagnosis not present

## 2019-05-24 DIAGNOSIS — H02055 Trichiasis without entropian left lower eyelid: Secondary | ICD-10-CM | POA: Diagnosis not present

## 2019-05-24 DIAGNOSIS — H02535 Eyelid retraction left lower eyelid: Secondary | ICD-10-CM | POA: Diagnosis not present

## 2019-05-24 DIAGNOSIS — L570 Actinic keratosis: Secondary | ICD-10-CM | POA: Diagnosis not present

## 2019-05-24 DIAGNOSIS — H02052 Trichiasis without entropian right lower eyelid: Secondary | ICD-10-CM | POA: Diagnosis not present

## 2019-05-24 DIAGNOSIS — H02532 Eyelid retraction right lower eyelid: Secondary | ICD-10-CM | POA: Diagnosis not present

## 2019-05-24 DIAGNOSIS — C441292 Squamous cell carcinoma of skin of left lower eyelid, including canthus: Secondary | ICD-10-CM | POA: Diagnosis not present

## 2019-08-22 DIAGNOSIS — C441292 Squamous cell carcinoma of skin of left lower eyelid, including canthus: Secondary | ICD-10-CM | POA: Diagnosis not present

## 2019-08-22 DIAGNOSIS — H02052 Trichiasis without entropian right lower eyelid: Secondary | ICD-10-CM | POA: Diagnosis not present

## 2019-08-22 DIAGNOSIS — H02055 Trichiasis without entropian left lower eyelid: Secondary | ICD-10-CM | POA: Diagnosis not present

## 2019-08-22 DIAGNOSIS — H01025 Squamous blepharitis left lower eyelid: Secondary | ICD-10-CM | POA: Diagnosis not present

## 2019-08-22 DIAGNOSIS — H01022 Squamous blepharitis right lower eyelid: Secondary | ICD-10-CM | POA: Diagnosis not present

## 2019-08-22 DIAGNOSIS — H02535 Eyelid retraction left lower eyelid: Secondary | ICD-10-CM | POA: Diagnosis not present

## 2019-08-22 DIAGNOSIS — H02532 Eyelid retraction right lower eyelid: Secondary | ICD-10-CM | POA: Diagnosis not present

## 2019-09-12 DIAGNOSIS — Z8639 Personal history of other endocrine, nutritional and metabolic disease: Secondary | ICD-10-CM | POA: Diagnosis not present

## 2019-09-12 DIAGNOSIS — E559 Vitamin D deficiency, unspecified: Secondary | ICD-10-CM | POA: Diagnosis not present

## 2019-09-12 DIAGNOSIS — I10 Essential (primary) hypertension: Secondary | ICD-10-CM | POA: Diagnosis not present

## 2019-09-12 DIAGNOSIS — M810 Age-related osteoporosis without current pathological fracture: Secondary | ICD-10-CM | POA: Diagnosis not present

## 2019-09-12 DIAGNOSIS — Z0000 Encounter for general adult medical examination without abnormal findings: Secondary | ICD-10-CM | POA: Diagnosis not present

## 2019-09-12 DIAGNOSIS — H04123 Dry eye syndrome of bilateral lacrimal glands: Secondary | ICD-10-CM | POA: Diagnosis not present

## 2019-09-29 DIAGNOSIS — D1801 Hemangioma of skin and subcutaneous tissue: Secondary | ICD-10-CM | POA: Diagnosis not present

## 2019-09-29 DIAGNOSIS — L814 Other melanin hyperpigmentation: Secondary | ICD-10-CM | POA: Diagnosis not present

## 2019-09-29 DIAGNOSIS — Z85828 Personal history of other malignant neoplasm of skin: Secondary | ICD-10-CM | POA: Diagnosis not present

## 2019-09-29 DIAGNOSIS — L821 Other seborrheic keratosis: Secondary | ICD-10-CM | POA: Diagnosis not present

## 2019-09-29 DIAGNOSIS — D225 Melanocytic nevi of trunk: Secondary | ICD-10-CM | POA: Diagnosis not present

## 2019-09-29 DIAGNOSIS — L570 Actinic keratosis: Secondary | ICD-10-CM | POA: Diagnosis not present

## 2019-09-29 DIAGNOSIS — L905 Scar conditions and fibrosis of skin: Secondary | ICD-10-CM | POA: Diagnosis not present

## 2019-09-29 DIAGNOSIS — L578 Other skin changes due to chronic exposure to nonionizing radiation: Secondary | ICD-10-CM | POA: Diagnosis not present

## 2019-11-08 DIAGNOSIS — Z23 Encounter for immunization: Secondary | ICD-10-CM | POA: Diagnosis not present

## 2019-12-05 DIAGNOSIS — H02532 Eyelid retraction right lower eyelid: Secondary | ICD-10-CM | POA: Diagnosis not present

## 2019-12-05 DIAGNOSIS — L570 Actinic keratosis: Secondary | ICD-10-CM | POA: Diagnosis not present

## 2019-12-05 DIAGNOSIS — C441292 Squamous cell carcinoma of skin of left lower eyelid, including canthus: Secondary | ICD-10-CM | POA: Diagnosis not present

## 2019-12-05 DIAGNOSIS — H01022 Squamous blepharitis right lower eyelid: Secondary | ICD-10-CM | POA: Diagnosis not present

## 2019-12-05 DIAGNOSIS — H02535 Eyelid retraction left lower eyelid: Secondary | ICD-10-CM | POA: Diagnosis not present

## 2019-12-05 DIAGNOSIS — H01025 Squamous blepharitis left lower eyelid: Secondary | ICD-10-CM | POA: Diagnosis not present

## 2019-12-05 DIAGNOSIS — H04123 Dry eye syndrome of bilateral lacrimal glands: Secondary | ICD-10-CM | POA: Diagnosis not present

## 2020-01-09 DIAGNOSIS — L718 Other rosacea: Secondary | ICD-10-CM | POA: Diagnosis not present

## 2020-01-09 DIAGNOSIS — H0288B Meibomian gland dysfunction left eye, upper and lower eyelids: Secondary | ICD-10-CM | POA: Diagnosis not present

## 2020-01-09 DIAGNOSIS — H0288A Meibomian gland dysfunction right eye, upper and lower eyelids: Secondary | ICD-10-CM | POA: Diagnosis not present

## 2020-01-09 DIAGNOSIS — Z79899 Other long term (current) drug therapy: Secondary | ICD-10-CM | POA: Diagnosis not present

## 2020-01-29 ENCOUNTER — Telehealth: Payer: Self-pay | Admitting: General Practice

## 2020-02-07 NOTE — Telephone Encounter (Signed)
Documentation

## 2021-11-26 DIAGNOSIS — J479 Bronchiectasis, uncomplicated: Principal | ICD-10-CM

## 2022-01-02 ENCOUNTER — Ambulatory Visit: Admit: 2022-01-02 | Discharge: 2022-01-03 | Payer: MEDICARE

## 2022-01-02 DIAGNOSIS — J479 Bronchiectasis, uncomplicated: Principal | ICD-10-CM

## 2022-01-06 ENCOUNTER — Ambulatory Visit: Admit: 2022-01-06 | Discharge: 2022-01-06 | Payer: MEDICARE

## 2022-01-06 ENCOUNTER — Ambulatory Visit: Admit: 2022-01-06 | Discharge: 2022-01-06 | Payer: MEDICARE | Attending: Registered" | Primary: Registered"

## 2022-01-06 ENCOUNTER — Encounter: Admit: 2022-01-06 | Discharge: 2022-01-06 | Payer: MEDICARE

## 2022-01-06 DIAGNOSIS — J479 Bronchiectasis, uncomplicated: Principal | ICD-10-CM

## 2022-01-06 MED ORDER — TRELEGY ELLIPTA 100 MCG-62.5 MCG-25 MCG POWDER FOR INHALATION
Freq: Every day | RESPIRATORY_TRACT | 3 refills | 60 days | Status: CP
Start: 2022-01-06 — End: ?

## 2022-01-07 ENCOUNTER — Ambulatory Visit: Admit: 2022-01-07 | Discharge: 2022-01-08 | Payer: MEDICARE

## 2022-01-08 DIAGNOSIS — J471 Bronchiectasis with (acute) exacerbation: Principal | ICD-10-CM

## 2022-01-08 MED ORDER — CIPROFLOXACIN 750 MG TABLET
ORAL_TABLET | Freq: Two times a day (BID) | ORAL | 0 refills | 14 days | Status: CP
Start: 2022-01-08 — End: 2022-01-08

## 2022-01-08 MED ORDER — TOBRAMYCIN 300 MG/5 ML IN 0.225 % SODIUM CHLORIDE FOR NEBULIZATION
Freq: Two times a day (BID) | RESPIRATORY_TRACT | 0 refills | 28 days | Status: CP
Start: 2022-01-08 — End: 2022-01-08

## 2022-01-12 DIAGNOSIS — J479 Bronchiectasis, uncomplicated: Principal | ICD-10-CM

## 2022-01-12 DIAGNOSIS — J471 Bronchiectasis with (acute) exacerbation: Principal | ICD-10-CM

## 2022-01-12 MED ORDER — TRELEGY ELLIPTA 100 MCG-62.5 MCG-25 MCG POWDER FOR INHALATION
Freq: Every day | RESPIRATORY_TRACT | 3 refills | 60 days | Status: CP
Start: 2022-01-12 — End: ?

## 2022-01-12 MED ORDER — TOBRAMYCIN 300 MG/5 ML IN 0.225 % SODIUM CHLORIDE FOR NEBULIZATION
Freq: Two times a day (BID) | RESPIRATORY_TRACT | 0 refills | 28.00000 days | Status: CP
Start: 2022-01-12 — End: 2022-01-12

## 2022-01-12 MED ORDER — CIPROFLOXACIN 750 MG TABLET
ORAL_TABLET | Freq: Two times a day (BID) | ORAL | 0 refills | 14.00000 days | Status: CP
Start: 2022-01-12 — End: 2022-01-26

## 2022-01-12 NOTE — Unmapped (Signed)
Addended by: Darnelle Bos on: 01/12/2022 09:00 AM     Modules accepted: Orders

## 2022-01-12 NOTE — Unmapped (Unsigned)
**INCOMPLETE - Ms. Urquilla is aware of HIGH COPAY ($1610.96) but is interested in other cost effective alternatives. I sent msg to provider.    [facility]   Patient Onboarding/Medication Counseling    Ms.Carsten is a 71 y.o. female with bronchiectasis who I am counseling today on initiation of therapy.  I am speaking to {Blank:19197::the patient,the patient's caregiver, ***,the patient's family member, ***,***}.    Was a Nurse, learning disability used for this call? {Blank single:19197::Yes, ***. Patient language is appropriate in WAM,No}    Verified patient's date of birth / HIPAA.    Specialty medication(s) to be sent: {specpharm:59087}      Non-specialty medications/supplies to be sent: ***      Medications not needed at this time: ***         Tobi (tobramycin)    Medication & Administration     Dosage: Inhale 1 ampule (300mg ) via nebulizer every 12 hours for 28 days only.     Administration:   Inhale contents of ampule sitting or standing upright and breathing normally through the mouthpiece of the nebulizer until there is no longer any mist being produced.    Usually last medication taken when on several inhaled therapies.    Adherence/Missed dose instructions: If you miss a dose of tobramycin Inhalation and it is 6 hours or less from the time you usually take your dose, then take your dose as soon as you can, then resume your next dose at the usual time. Otherwise skip the dose, and resume at your next scheduled dose.    Goals of Therapy     To treat or control bacterial infection in lungs    Side Effects & Monitoring Parameters     Voice alterations, loss of voice  Throat irritation  Bronchospasm     The following side effects should be reported to the provider:  Tinnitus (ringing of the ears) or hearing loss    Contraindications, Warnings, & Precautions     Ototoxicity    Drug/Food Interactions     Medication list reviewed in Epic. The patient was instructed to inform the care team before taking any new medications or supplements. No drug interactions identified.      Storage, Handling Precautions, & Disposal     Store in the refrigerator.  May be stored at room temperature for up to 28 days.      Current Medications (including OTC/herbals), Comorbidities and Allergies     Current Outpatient Medications   Medication Sig Dispense Refill    albuterol 2.5 mg /3 mL (0.083 %) nebulizer solution 3 mL (2.5 mg total).      albuterol HFA 90 mcg/actuation inhaler Inhale 2 puffs every six (6) hours as needed.      ciprofloxacin HCl (CIPRO) 750 MG tablet Take 1 tablet (750 mg total) by mouth two (2) times a day for 14 days. 28 tablet 0    fluticasone-umeclidin-vilanter (TRELEGY ELLIPTA) 100-62.5-25 mcg inhaler Inhale 1 puff daily. 60 each 3    losartan (COZAAR) 25 MG tablet Take 1 tablet (25 mg total) by mouth daily.      montelukast (SINGULAIR) 10 mg tablet Take 1 tablet (10 mg total) by mouth.      sodium chloride 7% 7 % Nebu 4 mL.      tobramycin, PF, (TOBI) 300 mg/5 mL nebulizer solution Inhale the contents of 1 ampule (300 mg total) by nebulization every twelve (12) hours for 28 days. 280 mL 0  No current facility-administered medications for this visit.       Allergies   Allergen Reactions    Penicillins Other (See Comments) and Rash       There is no problem list on file for this patient.      Reviewed and up to date in Epic.    Appropriateness of Therapy     Acute infections noted within Epic:  No active infections  Patient reported infection:  12/5 Lower respiratory culture was positive for 3+ Mucoid Pseudomonas aeruginosa    Is medication and dose appropriate based on diagnosis and infection status? Yes    Prescription has been clinically reviewed: Yes      Baseline Quality of Life Assessment      How many days over the past month did your bronchiectasis  keep you from your normal activities? For example, brushing your teeth or getting up in the morning. {Blank:19197::***,0,Patient declined to answer}    Financial Information     Medication Assistance provided: None Required    Anticipated copay of [phone] / 28 days reviewed with patient. Verified delivery address.    Delivery Information     Scheduled delivery date: ***    Expected start date: ***    Medication will be delivered via {Blank:19197::UPS,Next Day Courier,Same Day Courier,Clinic Courier - *** clinic,***} to the {Blank:19197::prescription,temporary} address in Epic WAM.  This shipment {Blank single:19197::will,will not} require a signature.      Explained the services we provide at [facility] and that each month we would call to set up refills.  Stressed importance of returning phone calls so that we could ensure they receive their medications in time each month.  Informed patient that we should be setting up refills 7-10 days prior to when they will run out of medication.  A pharmacist will reach out to perform a clinical assessment periodically.  Informed patient that a welcome packet, containing information about our pharmacy and other support services, a Notice of Privacy Practices, and a drug information handout will be sent.      The patient or caregiver noted above participated in the development of this care plan and knows that they can request review of or adjustments to the care plan at any time.      Patient or caregiver verbalized understanding of the above information as well as how to contact the pharmacy at [phone] option 4 with any questions/concerns.  The pharmacy is open Monday through Friday 8:30am-4:30pm.  A pharmacist is available 24/7 via pager to answer any clinical questions they may have.    Patient Specific Needs     Does the patient have any physical, cognitive, or cultural barriers? {Blank single:19197::No,Yes - ***}    Does the patient have adequate living arrangements? (i.e. the ability to store and take their medication appropriately) {Blank single:19197::Yes,No - ***}    Did you identify any home environmental safety or security hazards? {Blank single:19197::No,Yes - ***}    Patient prefers to have medications discussed with  {Blank single:19197::Patient,Family Member,Caregiver,Other}     Is the patient or caregiver able to read and understand education materials at a high school level or above? {Blank single:19197::No,Yes}    Patient's primary language is  {Blank single:19197::English,Spanish,***}     Is the patient high risk? {sschighriskpts:78327}    SOCIAL DETERMINANTS OF HEALTH     At the [facility], we have learned that life circumstances - like trouble affording food, housing, utilities, or transportation can affect  the health of many of our patients.   That is why we wanted to ask: are you currently experiencing any life circumstances that are negatively impacting your health and/or quality of life? {YES/NO/PATIENTDECLINED:93004}    Social Determinants of Health     Financial Resource Strain: Not on file   Internet Connectivity: Not on file   Food Insecurity: Not on file   Tobacco Use: Low Risk  (01/06/2022)    Patient History     Smoking Tobacco Use: Never     Smokeless Tobacco Use: Never     Passive Exposure: Past   Housing/Utilities: Not on file   Alcohol Use: Not on file   Transportation Needs: Not on file   Substance Use: Not on file   Health Literacy: Not on file   Physical Activity: Not on file   Interpersonal Safety: Not on file   Stress: Not on file   Intimate Partner Violence: Not on file   Depression: Not on file   Social Connections: Not on file       Would you be willing to receive help with any of the needs that you have identified today? {Yes/No/Not applicable:93005}       Oliva Bustard, PharmD  [facility] Specialty Pharmacist

## 2022-01-12 NOTE — Unmapped (Signed)
[facility] Specialty Medication Onboarding    Specialty Medication: tobramycin (PF) 300 mg/5 mL nebulizer solution (TOBI)  Prior Authorization: Not Required   Financial Assistance: No - copay card or grant not available   Final Copay/Day Supply: $1610.96 / 28    Insurance Restrictions: None     Notes to Pharmacist: ciprofloxacin $7.77    The triage team has completed the benefits investigation and has determined that the patient is able to fill this medication at [facility]. Please contact the patient to complete the onboarding or follow up with the prescribing physician as needed.

## 2022-01-13 LAB — STREP PNEUMONIAE ANTIBODY SEROTYPES
IGG SEROTYPE 14: 39.1 ug/mL
IGG SEROTYPE 18C: 20 ug/mL
IGG SEROTYPE 19F: 10.4 ug/mL
IGG SEROTYPE 23F: 35.2 ug/mL
IGG SEROTYPE 4: 0.4 ug/mL
PNEUMOCOCCAL ANTIBODY TYPE 3: 3.5 ug/mL
PNEUMOCOCCAL IGG SEROTYPE 10A: 15.9 ug/mL
PNEUMOCOCCAL IGG SEROTYPE 11A: 1.1 ug/mL
PNEUMOCOCCAL IGG SEROTYPE 12F: 0.5 ug/mL
PNEUMOCOCCAL IGG SEROTYPE 15B: 25 ug/mL
PNEUMOCOCCAL IGG SEROTYPE 17F: 2 ug/mL
PNEUMOCOCCAL IGG SEROTYPE 19A: 2.4 ug/mL
PNEUMOCOCCAL IGG SEROTYPE 20: 6 ug/mL
PNEUMOCOCCAL IGG SEROTYPE 22F: 1.6 ug/mL
PNEUMOCOCCAL IGG SEROTYPE 2: 6.8 ug/mL

## 2022-01-13 LAB — COMPREHENSIVE ENVIRONMENTAL PANEL
ALTERNARIA ALTERNATA IGE: <0.35 kU/L (ref <0.35)
ASPERGILLUS FUMIGATUS IGE: <0.35 kU/L (ref <0.35)
ASPERGILLUS NIGER IGE: <0.35 kU/L (ref <0.35)
BAHIA GRASS IGE: <0.35 kU/L (ref <0.35)
BEECH (AMERICAN) TREE IGE: <0.35 kU/L (ref <0.35)
BERMUDA GRASS IGE: <0.35 kU/L (ref <0.35)
BIRCH (COMMON SILVER) TREE IGE: <0.35 kU/L (ref <0.35)
BOX ELDER TREE IGE: 0.7 kU/L — ABNORMAL HIGH (ref <0.35)
CANDIDA ALBICANS IGE: <0.35 kU/L (ref <0.35)
CAT DANDER IGE: <0.35 kU/L (ref <0.35)
CLADOSPORIUM HERBARUM IGE: <0.35 kU/L (ref <0.35)
COCKLEBUR IGE: <0.35 kU/L (ref <0.35)
COTTONWOOD (WHITE POPLAR) TREE IGE: <0.35 kU/L (ref <0.35)
D. FARINAE IGE: 7.32 kU/L — ABNORMAL HIGH (ref <0.35)
D. PTERONYSSINUS IGE: 4.77 kU/L — ABNORMAL HIGH (ref <0.35)
DOG DANDER IGE: <0.35 kU/L (ref <0.35)
ELM TREE IGE: <0.35 kU/L (ref <0.35)
ENGLISH PLANTAIN IGE: <0.35 kU/L (ref <0.35)
EPICOCCUM PURPURASCENS IGE: <0.35 kU/L (ref <0.35)
FUSARIUM PROLIFERATUM IGE: <0.35 kU/L (ref <0.35)
GERMAN COCKROACH IGE: <0.35 kU/L (ref <0.35)
GIANT RAGWEED IGE: <0.35 kU/L (ref <0.35)
GOOSEFOOT (LAMB'S QUARTERS) IGE: <0.35 kU/L (ref <0.35)
JOHNSON GRASS IGE: <0.35 kU/L (ref <0.35)
MAPLE LEAF SYCAMORE IGE: <0.35 kU/L (ref <0.35)
MOUSE IGE: <0.35 kU/L (ref <0.35)
MUCOR RACEMOSUS IGE: <0.35 kU/L (ref <0.35)
MUGWORT IGE: <0.35 kU/L (ref <0.35)
P CHRYSOGENUM (P NOTATUM) IGE: <0.35 kU/L (ref <0.35)
PECAN (HICKORY) IGE: <0.35 kU/L (ref <0.35)
PIGWEED, COMMON IGE: <0.35 kU/L (ref <0.35)
RAGWEED, SHORT (COMMON) IGE: <0.35 kU/L (ref <0.35)
RHIZOPUS NIGRICAN IGE: <0.35 kU/L (ref <0.35)
SETOMELANOMMA ROSTRATA (H. HALODES) IGE: <0.35 kU/L (ref <0.35)
SHEEP SORREL IGE: <0.35 kU/L (ref <0.35)
TIMOTHY GRASS IGE: <0.35 kU/L (ref <0.35)
TRICHOPHYTON RUBRUM IGE: <0.35 kU/L (ref <0.35)
WALNUT TREE IGE: <0.35 kU/L (ref <0.35)
WHITE ASH TREE IGE: <0.35 kU/L (ref <0.35)
WHITE OAK TREE IGE: <0.35 kU/L (ref <0.35)
WILLOW TREE IGE: <0.35 kU/L (ref <0.35)

## 2022-01-14 ENCOUNTER — Encounter: Admit: 2022-01-14 | Discharge: 2022-01-14 | Payer: MEDICARE

## 2022-01-19 ENCOUNTER — Encounter: Admit: 2022-01-19 | Discharge: 2022-01-19 | Payer: MEDICARE

## 2022-02-27 DIAGNOSIS — J479 Bronchiectasis, uncomplicated: Principal | ICD-10-CM

## 2022-02-27 MED ORDER — LEVALBUTEROL 0.63 MG/3 ML SOLUTION FOR NEBULIZATION
Freq: Two times a day (BID) | RESPIRATORY_TRACT | 3 refills | 90 days | Status: CP
Start: 2022-02-27 — End: ?

## 2022-04-07 ENCOUNTER — Ambulatory Visit: Admit: 2022-04-07 | Discharge: 2022-04-07 | Payer: MEDICARE

## 2022-04-07 ENCOUNTER — Ambulatory Visit: Admit: 2022-04-07 | Discharge: 2022-04-07 | Payer: MEDICARE | Attending: Registered" | Primary: Registered"

## 2022-04-07 DIAGNOSIS — Z9109 Other allergy status, other than to drugs and biological substances: Principal | ICD-10-CM

## 2022-04-07 DIAGNOSIS — J479 Bronchiectasis, uncomplicated: Principal | ICD-10-CM

## 2022-04-07 MED ORDER — LEVALBUTEROL HFA 45 MCG/ACTUATION AEROSOL INHALER
Freq: Two times a day (BID) | RESPIRATORY_TRACT | 2 refills | 0 days | Status: CP | PRN
Start: 2022-04-07 — End: 2023-04-07

## 2022-04-07 MED ORDER — MONTELUKAST 10 MG TABLET
ORAL_TABLET | Freq: Every evening | ORAL | 3 refills | 90 days | Status: CP
Start: 2022-04-07 — End: ?

## 2022-04-16 DIAGNOSIS — J479 Bronchiectasis, uncomplicated: Principal | ICD-10-CM

## 2022-04-16 MED ORDER — TRELEGY ELLIPTA 100 MCG-62.5 MCG-25 MCG POWDER FOR INHALATION
Freq: Every day | RESPIRATORY_TRACT | 3 refills | 60 days | Status: CP
Start: 2022-04-16 — End: ?

## 2022-05-26 DIAGNOSIS — J479 Bronchiectasis, uncomplicated: Principal | ICD-10-CM

## 2022-05-26 MED ORDER — SODIUM CHLORIDE 7 % FOR NEBULIZATION
Freq: Two times a day (BID) | RESPIRATORY_TRACT | 3 refills | 90 days | Status: CP
Start: 2022-05-26 — End: ?

## 2022-07-03 DIAGNOSIS — J479 Bronchiectasis, uncomplicated: Principal | ICD-10-CM

## 2022-07-03 MED ORDER — SODIUM CHLORIDE 7 % FOR NEBULIZATION
Freq: Two times a day (BID) | RESPIRATORY_TRACT | 3 refills | 90 days
Start: 2022-07-03 — End: ?

## 2022-07-06 MED ORDER — SODIUM CHLORIDE 7 % FOR NEBULIZATION
Freq: Two times a day (BID) | RESPIRATORY_TRACT | 3 refills | 90 days
Start: 2022-07-06 — End: ?

## 2022-07-13 ENCOUNTER — Ambulatory Visit: Admit: 2022-07-13 | Discharge: 2022-07-13 | Payer: MEDICARE

## 2022-10-07 ENCOUNTER — Ambulatory Visit: Admit: 2022-10-07 | Discharge: 2022-10-07 | Payer: MEDICARE

## 2022-10-07 DIAGNOSIS — J479 Bronchiectasis, uncomplicated: Principal | ICD-10-CM

## 2022-10-07 DIAGNOSIS — I10 Essential (primary) hypertension: Principal | ICD-10-CM

## 2022-10-07 DIAGNOSIS — E871 Hypo-osmolality and hyponatremia: Principal | ICD-10-CM

## 2022-10-07 DIAGNOSIS — R0600 Dyspnea, unspecified: Principal | ICD-10-CM

## 2022-10-14 DIAGNOSIS — J479 Bronchiectasis, uncomplicated: Principal | ICD-10-CM

## 2022-10-14 DIAGNOSIS — Z9109 Other allergy status, other than to drugs and biological substances: Principal | ICD-10-CM

## 2022-10-14 MED ORDER — TRELEGY ELLIPTA 100 MCG-62.5 MCG-25 MCG POWDER FOR INHALATION
RESPIRATORY_TRACT | 3 refills | 90 days | Status: CP
Start: 2022-10-14 — End: ?

## 2022-10-19 DIAGNOSIS — J479 Bronchiectasis, uncomplicated: Principal | ICD-10-CM

## 2022-10-19 MED ORDER — TRELEGY ELLIPTA 100 MCG-62.5 MCG-25 MCG POWDER FOR INHALATION
RESPIRATORY_TRACT | 3 refills | 90 days | Status: CP
Start: 2022-10-19 — End: ?

## 2022-11-17 ENCOUNTER — Ambulatory Visit: Admit: 2022-11-17 | Discharge: 2022-11-18 | Payer: MEDICARE

## 2022-11-17 DIAGNOSIS — J479 Bronchiectasis, uncomplicated: Principal | ICD-10-CM

## 2022-12-04 DIAGNOSIS — J471 Bronchiectasis with (acute) exacerbation: Principal | ICD-10-CM

## 2022-12-04 MED ORDER — LEVOFLOXACIN 750 MG TABLET
ORAL_TABLET | Freq: Every day | ORAL | 0 refills | 7 days | Status: CP
Start: 2022-12-04 — End: 2022-12-04

## 2022-12-14 ENCOUNTER — Ambulatory Visit: Admit: 2022-12-14 | Discharge: 2022-12-15 | Payer: MEDICARE

## 2022-12-22 MED ORDER — TIOTROPIUM 2.5 MCG-OLODATEROL 2.5 MCG/ACTUATION MIST FOR INHALATION
Freq: Every day | RESPIRATORY_TRACT | 11 refills | 14 days | Status: CP
Start: 2022-12-22 — End: 2023-12-22

## 2023-01-18 DIAGNOSIS — J449 Chronic obstructive pulmonary disease, unspecified: Principal | ICD-10-CM

## 2023-01-28 DIAGNOSIS — J479 Bronchiectasis, uncomplicated: Principal | ICD-10-CM

## 2023-01-28 MED ORDER — LEVALBUTEROL 0.63 MG/3 ML SOLUTION FOR NEBULIZATION
Freq: Two times a day (BID) | RESPIRATORY_TRACT | 3 refills | 90.00 days | Status: CP
Start: 2023-01-28 — End: ?

## 2023-02-10 DIAGNOSIS — J479 Bronchiectasis, uncomplicated: Principal | ICD-10-CM

## 2023-03-09 MED ORDER — TOBRAMYCIN 300 MG/5 ML IN 0.225 % SODIUM CHLORIDE FOR NEBULIZATION
Freq: Two times a day (BID) | RESPIRATORY_TRACT | 0 refills | 28.00 days | Status: CP
Start: 2023-03-09 — End: 2023-03-09

## 2023-03-09 MED ORDER — CIPROFLOXACIN 500 MG TABLET
ORAL_TABLET | Freq: Two times a day (BID) | ORAL | 0 refills | 10.00 days | Status: CP
Start: 2023-03-09 — End: 2023-03-09

## 2023-03-17 DIAGNOSIS — J151 Pneumonia due to Pseudomonas: Principal | ICD-10-CM

## 2023-03-17 MED ORDER — CIPROFLOXACIN 500 MG TABLET
ORAL_TABLET | Freq: Two times a day (BID) | ORAL | 0 refills | 10.00 days | Status: CP
Start: 2023-03-17 — End: 2023-03-27

## 2023-04-13 ENCOUNTER — Ambulatory Visit: Admit: 2023-04-13 | Discharge: 2023-04-14 | Payer: MEDICARE | Attending: Registered" | Primary: Registered"

## 2023-04-13 ENCOUNTER — Ambulatory Visit: Admit: 2023-04-13 | Discharge: 2023-04-14 | Payer: MEDICARE

## 2023-04-13 DIAGNOSIS — J479 Bronchiectasis, uncomplicated: Principal | ICD-10-CM

## 2023-04-30 DIAGNOSIS — J479 Bronchiectasis, uncomplicated: Principal | ICD-10-CM

## 2023-04-30 MED ORDER — LEVALBUTEROL 0.63 MG/3 ML SOLUTION FOR NEBULIZATION
Freq: Two times a day (BID) | RESPIRATORY_TRACT | 3 refills | 90 days | Status: CP
Start: 2023-04-30 — End: ?

## 2023-04-30 MED ORDER — SODIUM CHLORIDE 7 % FOR NEBULIZATION
Freq: Two times a day (BID) | RESPIRATORY_TRACT | 3 refills | 90 days | Status: CP
Start: 2023-04-30 — End: ?

## 2023-08-02 ENCOUNTER — Inpatient Hospital Stay: Admit: 2023-08-02 | Discharge: 2023-08-03 | Payer: MEDICARE

## 2023-10-12 ENCOUNTER — Ambulatory Visit: Admit: 2023-10-12 | Discharge: 2023-10-13 | Payer: MEDICARE

## 2023-11-19 DIAGNOSIS — J449 Chronic obstructive pulmonary disease, unspecified: Principal | ICD-10-CM

## 2023-11-19 MED ORDER — TIOTROPIUM 2.5 MCG-OLODATEROL 2.5 MCG/ACTUATION MIST FOR INHALATION
Freq: Every day | RESPIRATORY_TRACT | 0.00000 days
Start: 2023-11-19 — End: 2024-11-18

## 2023-11-22 MED ORDER — TIOTROPIUM 2.5 MCG-OLODATEROL 2.5 MCG/ACTUATION MIST FOR INHALATION
Freq: Every day | RESPIRATORY_TRACT | 3 refills | 90.00000 days | Status: CP
Start: 2023-11-22 — End: 2024-11-21

## 2023-12-23 DIAGNOSIS — J479 Bronchiectasis, uncomplicated: Principal | ICD-10-CM

## 2023-12-27 DIAGNOSIS — J151 Pneumonia due to Pseudomonas: Principal | ICD-10-CM

## 2023-12-27 MED ORDER — CIPROFLOXACIN 750 MG TABLET
ORAL_TABLET | Freq: Two times a day (BID) | ORAL | 0 refills | 14.00000 days | Status: CP
Start: 2023-12-27 — End: 2024-01-10

## 2023-12-27 MED ORDER — TOBRAMYCIN 300 MG/5 ML IN 0.225 % SODIUM CHLORIDE FOR NEBULIZATION
Freq: Two times a day (BID) | RESPIRATORY_TRACT | 0 refills | 28.00000 days | Status: CP
Start: 2023-12-27 — End: 2023-12-27
